# Patient Record
Sex: Female | Born: 1935 | Race: White | Hispanic: No | Smoking: Never smoker
Health system: Southern US, Community
[De-identification: ages and names within clinical notes are randomized; demographics above are authoritative.]

## PROBLEM LIST (undated history)

## (undated) DIAGNOSIS — I2510 Atherosclerotic heart disease of native coronary artery without angina pectoris: Secondary | ICD-10-CM

## (undated) DIAGNOSIS — Z9289 Personal history of other medical treatment: Secondary | ICD-10-CM

## (undated) DIAGNOSIS — C55 Malignant neoplasm of uterus, part unspecified: Secondary | ICD-10-CM

## (undated) DIAGNOSIS — I10 Essential (primary) hypertension: Secondary | ICD-10-CM

## (undated) DIAGNOSIS — I4891 Unspecified atrial fibrillation: Secondary | ICD-10-CM

## (undated) DIAGNOSIS — E785 Hyperlipidemia, unspecified: Secondary | ICD-10-CM

## (undated) HISTORY — DX: Atherosclerotic heart disease of native coronary artery without angina pectoris: I25.10

## (undated) HISTORY — DX: Personal history of other medical treatment: Z92.89

## (undated) HISTORY — DX: Unspecified atrial fibrillation: I48.91

## (undated) HISTORY — DX: Hyperlipidemia, unspecified: E78.5

## (undated) HISTORY — DX: Essential (primary) hypertension: I10

## (undated) HISTORY — PX: FRACTURE SURGERY: SHX138

## (undated) HISTORY — DX: Malignant neoplasm of uterus, part unspecified: C55

---

## 1962-11-25 DIAGNOSIS — C55 Malignant neoplasm of uterus, part unspecified: Secondary | ICD-10-CM

## 1962-11-25 HISTORY — DX: Malignant neoplasm of uterus, part unspecified: C55

## 1962-11-25 HISTORY — PX: ABDOMINAL HYSTERECTOMY: SHX81

## 2000-09-01 ENCOUNTER — Other Ambulatory Visit: Admission: RE | Admit: 2000-09-01 | Discharge: 2000-09-01 | Payer: Self-pay | Admitting: Unknown Physician Specialty

## 2001-01-27 ENCOUNTER — Encounter: Payer: Self-pay | Admitting: Internal Medicine

## 2001-01-27 ENCOUNTER — Encounter: Admission: RE | Admit: 2001-01-27 | Discharge: 2001-01-27 | Payer: Self-pay | Admitting: Internal Medicine

## 2001-04-10 ENCOUNTER — Encounter: Payer: Self-pay | Admitting: Internal Medicine

## 2001-04-10 ENCOUNTER — Encounter: Admission: RE | Admit: 2001-04-10 | Discharge: 2001-04-10 | Payer: Self-pay | Admitting: Internal Medicine

## 2001-07-09 ENCOUNTER — Encounter: Admission: RE | Admit: 2001-07-09 | Discharge: 2001-07-09 | Payer: Self-pay | Admitting: Internal Medicine

## 2001-07-09 ENCOUNTER — Encounter: Payer: Self-pay | Admitting: Internal Medicine

## 2001-09-03 ENCOUNTER — Ambulatory Visit (HOSPITAL_COMMUNITY): Admission: RE | Admit: 2001-09-03 | Discharge: 2001-09-03 | Payer: Self-pay | Admitting: Gastroenterology

## 2002-08-24 ENCOUNTER — Encounter: Admission: RE | Admit: 2002-08-24 | Discharge: 2002-08-24 | Payer: Self-pay | Admitting: Internal Medicine

## 2002-08-24 ENCOUNTER — Encounter: Payer: Self-pay | Admitting: Internal Medicine

## 2003-08-26 ENCOUNTER — Encounter: Payer: Self-pay | Admitting: Internal Medicine

## 2003-08-26 ENCOUNTER — Encounter: Admission: RE | Admit: 2003-08-26 | Discharge: 2003-08-26 | Payer: Self-pay | Admitting: Internal Medicine

## 2004-02-13 ENCOUNTER — Ambulatory Visit: Admission: RE | Admit: 2004-02-13 | Discharge: 2004-02-13 | Payer: Self-pay | Admitting: Internal Medicine

## 2004-08-27 ENCOUNTER — Encounter: Admission: RE | Admit: 2004-08-27 | Discharge: 2004-08-27 | Payer: Self-pay | Admitting: Internal Medicine

## 2005-09-05 ENCOUNTER — Encounter: Admission: RE | Admit: 2005-09-05 | Discharge: 2005-09-05 | Payer: Self-pay | Admitting: Internal Medicine

## 2006-08-06 ENCOUNTER — Encounter: Admission: RE | Admit: 2006-08-06 | Discharge: 2006-08-06 | Payer: Self-pay | Admitting: Internal Medicine

## 2006-12-15 ENCOUNTER — Encounter: Admission: RE | Admit: 2006-12-15 | Discharge: 2006-12-15 | Payer: Self-pay | Admitting: Internal Medicine

## 2006-12-22 ENCOUNTER — Encounter: Admission: RE | Admit: 2006-12-22 | Discharge: 2006-12-22 | Payer: Self-pay | Admitting: Internal Medicine

## 2007-08-10 ENCOUNTER — Encounter: Admission: RE | Admit: 2007-08-10 | Discharge: 2007-08-10 | Payer: Self-pay | Admitting: General Surgery

## 2008-08-15 ENCOUNTER — Encounter: Admission: RE | Admit: 2008-08-15 | Discharge: 2008-08-15 | Payer: Self-pay | Admitting: Internal Medicine

## 2009-08-16 ENCOUNTER — Encounter: Admission: RE | Admit: 2009-08-16 | Discharge: 2009-08-16 | Payer: Self-pay | Admitting: Internal Medicine

## 2010-08-07 ENCOUNTER — Ambulatory Visit (HOSPITAL_COMMUNITY): Admission: RE | Admit: 2010-08-07 | Discharge: 2010-08-07 | Payer: Self-pay | Admitting: Cardiology

## 2010-08-07 HISTORY — PX: TEE WITH CARDIOVERSION: SHX5442

## 2010-08-20 ENCOUNTER — Encounter: Admission: RE | Admit: 2010-08-20 | Discharge: 2010-08-20 | Payer: Self-pay | Admitting: Internal Medicine

## 2010-08-25 DIAGNOSIS — Z9289 Personal history of other medical treatment: Secondary | ICD-10-CM

## 2010-08-25 HISTORY — DX: Personal history of other medical treatment: Z92.89

## 2010-12-15 ENCOUNTER — Encounter: Payer: Self-pay | Admitting: Internal Medicine

## 2011-02-07 LAB — PROTIME-INR
INR: 2.24 — ABNORMAL HIGH (ref 0.00–1.49)
Prothrombin Time: 24.9 seconds — ABNORMAL HIGH (ref 11.6–15.2)

## 2011-02-07 LAB — BASIC METABOLIC PANEL
BUN: 12 mg/dL (ref 6–23)
CO2: 28 mEq/L (ref 19–32)
Chloride: 106 mEq/L (ref 96–112)
Creatinine, Ser: 0.85 mg/dL (ref 0.4–1.2)
GFR calc Af Amer: >60 mL/min (ref >60)
Glucose, Bld: 107 mg/dL — ABNORMAL HIGH (ref 70–99)
Potassium: 4.5 mEq/L (ref 3.5–5.1)
Sodium: 141 mEq/L (ref 135–145)

## 2011-02-07 LAB — CBC
Hemoglobin: 14.6 g/dL (ref 12.0–15.0)
MCH: 28.2 pg (ref 26.0–34.0)
MCHC: 33.4 g/dL (ref 30.0–36.0)
MCV: 84.5 fL (ref 78.0–100.0)
RBC: 5.17 MIL/uL — ABNORMAL HIGH (ref 3.87–5.11)
RDW: 14 % (ref 11.5–15.5)

## 2011-04-12 NOTE — Procedures (Signed)
[place]  Patient:    HANNI, MILFORD Visit Number: [id number] MRN: [id number]          Service Type: END Location: ENDO Attending Physician:  Charna Elizabeth Dictated by:   Anselmo Rod, M.D. Proc. Date: 09/03/01 Admit Date:  09/03/2001   CC:         Rodrigo Ran, M.D.   Procedure Report  DATE OF BIRTH:  1929-04-03.  REFERRING PHYSICIAN:  Rodrigo Ran, M.D.  PROCEDURE PERFORMED:  Screening colonoscopy.  ENDOSCOPIST:  Anselmo Rod, M.D.  INSTRUMENT USED:  Olympus video colonoscope.  INDICATIONS FOR PROCEDURE:  Screening colonoscopy being performed in this 77 year old white female rule out colonic polyps masses, hemorrhoids etc.  PREPROCEDURE PREPARATION:  Informed consent was procured from the patient. The patient was fasted for eight hours prior to the procedure and prepped with a bottle of magnesium citrate and a gallon of NuLytely the night prior to the procedure.  PREPROCEDURE PHYSICAL:  The patient had stable vital signs.  Neck supple. Chest clear to auscultation.  S1, S2 regular.  Abdomen soft with normal abdominal bowel sounds.  DESCRIPTION OF PROCEDURE:  The patient was placed in the left lateral decubitus position and sedated with 50 mg of Demerol and 7 mg of Versed intravenously.  Once the patient was adequately sedated and maintained on low-flow oxygen and continuous cardiac monitoring, the Olympus video colonoscope was advanced from the rectum to the cecum with slight difficulty secondary to some residual stool in the colon.  No masses, polyps, erosions, ulcerations or diverticula were seen.  Small internal hemorrhoids were appreciated on retroflexion in the rectum.  The appendicular orifice and ileocecal valve were clearly visualized and photographed.  IMPRESSION: 1. Healthy-appearing colon except for small nonbleeding internal hemorrhoid. 2. Small amount of residual stool in the colon.  Small lesions could  have been    missed.  RECOMMENDATIONS:  Repeat colorectal cancer screening is recommended in the next 10 years unless the patient were to develop any abnormal symptoms in the interim. Dictated by:   Anselmo Rod, M.D. Attending Physician:  Charna Elizabeth DD:  09/03/01 TD:  09/03/01 Job: [id number]

## 2011-04-15 ENCOUNTER — Encounter (INDEPENDENT_AMBULATORY_CARE_PROVIDER_SITE_OTHER): Payer: Self-pay | Admitting: General Surgery

## 2011-07-16 ENCOUNTER — Other Ambulatory Visit: Payer: Self-pay | Admitting: Internal Medicine

## 2011-07-16 DIAGNOSIS — Z1231 Encounter for screening mammogram for malignant neoplasm of breast: Secondary | ICD-10-CM

## 2011-08-22 ENCOUNTER — Ambulatory Visit
Admission: RE | Admit: 2011-08-22 | Discharge: 2011-08-22 | Disposition: A | Discharge status: Home / Self Care | Payer: Medicare Other | Source: Ambulatory Visit | Attending: Internal Medicine | Admitting: Internal Medicine

## 2011-08-22 DIAGNOSIS — Z1231 Encounter for screening mammogram for malignant neoplasm of breast: Secondary | ICD-10-CM

## 2011-12-17 DIAGNOSIS — H353190 Nonexudative age-related macular degeneration, unspecified eye, stage unspecified: Secondary | ICD-10-CM | POA: Diagnosis not present

## 2011-12-17 DIAGNOSIS — H35379 Puckering of macula, unspecified eye: Secondary | ICD-10-CM | POA: Diagnosis not present

## 2011-12-25 DIAGNOSIS — Z7901 Long term (current) use of anticoagulants: Secondary | ICD-10-CM | POA: Diagnosis not present

## 2011-12-25 DIAGNOSIS — I4891 Unspecified atrial fibrillation: Secondary | ICD-10-CM | POA: Diagnosis not present

## 2012-01-13 ENCOUNTER — Encounter (INDEPENDENT_AMBULATORY_CARE_PROVIDER_SITE_OTHER): Payer: Self-pay | Admitting: General Surgery

## 2012-01-13 ENCOUNTER — Ambulatory Visit (INDEPENDENT_AMBULATORY_CARE_PROVIDER_SITE_OTHER): Payer: Medicare Other | Admitting: General Surgery

## 2012-01-13 VITALS — BP 108/66 | HR 88 | Temp 97.2°F | Resp 16 | Ht 68.5 in | Wt 194.2 lb

## 2012-01-13 DIAGNOSIS — N6452 Nipple discharge: Secondary | ICD-10-CM | POA: Insufficient documentation

## 2012-01-13 DIAGNOSIS — N6459 Other signs and symptoms in breast: Secondary | ICD-10-CM

## 2012-01-13 NOTE — Patient Instructions (Signed)
Continue regular self exams  

## 2012-01-13 NOTE — Progress Notes (Signed)
Subjective:     Patient ID: Sheena Benson, female   DOB: May 25, 1929, 78 y.o.   MRN: [id number]  HPI The patient is an 78 year old white female who is now 5 years out from some clear nipple discharge from the right breast. It occurs about 2 times a week. She denies any breast pain. She has had mammograms MRIs and ductograms that have  all been negative. She has been well since her last visit and has no complaints  Review of Systems  Constitutional: Negative.   HENT: Negative.   Eyes: Negative.   Respiratory: Negative.   Cardiovascular: Negative.   Gastrointestinal: Negative.   Genitourinary: Negative.   Musculoskeletal: Negative.   Skin: Negative.   Neurological: Negative.   Hematological: Negative.   Psychiatric/Behavioral: Negative.        Objective:   Physical Exam  Constitutional: She is oriented to person, place, and time. She appears well-developed and well-nourished.  HENT:  Head: Normocephalic and atraumatic.  Eyes: Conjunctivae and EOM are normal. Pupils are equal, round, and reactive to light.  Neck: Normal range of motion. Neck supple.  Cardiovascular: Normal rate, regular rhythm and normal heart sounds.   Pulmonary/Chest: Effort normal and breath sounds normal.       No palpable mass in either breast. no axillary supraclavicular or cervical lymphadenopathy on either side. She still has clear nipple discharge from the center of the right nipple with palpation  Abdominal: Soft. Bowel sounds are normal. She exhibits no mass. There is no tenderness.  Musculoskeletal: Normal range of motion.  Neurological: She is alert and oriented to person, place, and time.  Skin: Skin is warm and dry.  Psychiatric: She has a normal mood and affect. Her behavior is normal.       Assessment:     Persistent clear nipple discharge from the right breast.    Plan:     Her recent mammogram showed no evidence of malignancy. She will she will continue to do regular self exams. We'll plan  to see her back in one year.

## 2012-01-21 DIAGNOSIS — M5137 Other intervertebral disc degeneration, lumbosacral region: Secondary | ICD-10-CM | POA: Diagnosis not present

## 2012-01-21 DIAGNOSIS — M999 Biomechanical lesion, unspecified: Secondary | ICD-10-CM | POA: Diagnosis not present

## 2012-01-23 DIAGNOSIS — M5137 Other intervertebral disc degeneration, lumbosacral region: Secondary | ICD-10-CM | POA: Diagnosis not present

## 2012-01-23 DIAGNOSIS — M999 Biomechanical lesion, unspecified: Secondary | ICD-10-CM | POA: Diagnosis not present

## 2012-01-27 DIAGNOSIS — M5137 Other intervertebral disc degeneration, lumbosacral region: Secondary | ICD-10-CM | POA: Diagnosis not present

## 2012-01-27 DIAGNOSIS — M999 Biomechanical lesion, unspecified: Secondary | ICD-10-CM | POA: Diagnosis not present

## 2012-01-30 DIAGNOSIS — M999 Biomechanical lesion, unspecified: Secondary | ICD-10-CM | POA: Diagnosis not present

## 2012-01-30 DIAGNOSIS — M5137 Other intervertebral disc degeneration, lumbosacral region: Secondary | ICD-10-CM | POA: Diagnosis not present

## 2012-02-05 DIAGNOSIS — Z7901 Long term (current) use of anticoagulants: Secondary | ICD-10-CM | POA: Diagnosis not present

## 2012-02-05 DIAGNOSIS — M5137 Other intervertebral disc degeneration, lumbosacral region: Secondary | ICD-10-CM | POA: Diagnosis not present

## 2012-02-05 DIAGNOSIS — M999 Biomechanical lesion, unspecified: Secondary | ICD-10-CM | POA: Diagnosis not present

## 2012-02-05 DIAGNOSIS — I4891 Unspecified atrial fibrillation: Secondary | ICD-10-CM | POA: Diagnosis not present

## 2012-03-18 DIAGNOSIS — Z7901 Long term (current) use of anticoagulants: Secondary | ICD-10-CM | POA: Diagnosis not present

## 2012-03-18 DIAGNOSIS — I4891 Unspecified atrial fibrillation: Secondary | ICD-10-CM | POA: Diagnosis not present

## 2012-04-28 DIAGNOSIS — I4891 Unspecified atrial fibrillation: Secondary | ICD-10-CM | POA: Diagnosis not present

## 2012-04-28 DIAGNOSIS — Z7901 Long term (current) use of anticoagulants: Secondary | ICD-10-CM | POA: Diagnosis not present

## 2012-06-10 DIAGNOSIS — Z7901 Long term (current) use of anticoagulants: Secondary | ICD-10-CM | POA: Diagnosis not present

## 2012-06-10 DIAGNOSIS — I4891 Unspecified atrial fibrillation: Secondary | ICD-10-CM | POA: Diagnosis not present

## 2012-06-18 DIAGNOSIS — H353190 Nonexudative age-related macular degeneration, unspecified eye, stage unspecified: Secondary | ICD-10-CM | POA: Diagnosis not present

## 2012-06-18 DIAGNOSIS — H35379 Puckering of macula, unspecified eye: Secondary | ICD-10-CM | POA: Diagnosis not present

## 2012-07-22 DIAGNOSIS — Z7901 Long term (current) use of anticoagulants: Secondary | ICD-10-CM | POA: Diagnosis not present

## 2012-07-22 DIAGNOSIS — I4891 Unspecified atrial fibrillation: Secondary | ICD-10-CM | POA: Diagnosis not present

## 2012-07-23 ENCOUNTER — Other Ambulatory Visit: Payer: Self-pay | Admitting: Internal Medicine

## 2012-07-23 DIAGNOSIS — Z1231 Encounter for screening mammogram for malignant neoplasm of breast: Secondary | ICD-10-CM

## 2012-08-07 DIAGNOSIS — Z23 Encounter for immunization: Secondary | ICD-10-CM | POA: Diagnosis not present

## 2012-08-24 ENCOUNTER — Ambulatory Visit
Admission: RE | Admit: 2012-08-24 | Discharge: 2012-08-24 | Disposition: A | Discharge status: Home / Self Care | Payer: Medicare Other | Source: Ambulatory Visit | Attending: Internal Medicine | Admitting: Internal Medicine

## 2012-08-24 DIAGNOSIS — Z1231 Encounter for screening mammogram for malignant neoplasm of breast: Secondary | ICD-10-CM | POA: Diagnosis not present

## 2012-08-25 ENCOUNTER — Telehealth (INDEPENDENT_AMBULATORY_CARE_PROVIDER_SITE_OTHER): Payer: Self-pay | Admitting: General Surgery

## 2012-08-25 NOTE — Telephone Encounter (Signed)
Message copied by Littie Deeds on Tue Aug 25, 2012 12:48 PM ------      Message from: Caleen Essex III      Created: Tue Aug 25, 2012  8:48 AM       Looks neg

## 2012-08-25 NOTE — Telephone Encounter (Signed)
Spoke with pt and informed her that we received her MGM report and that it came back negative.

## 2012-09-02 DIAGNOSIS — Z7901 Long term (current) use of anticoagulants: Secondary | ICD-10-CM | POA: Diagnosis not present

## 2012-09-02 DIAGNOSIS — I4891 Unspecified atrial fibrillation: Secondary | ICD-10-CM | POA: Diagnosis not present

## 2012-10-14 DIAGNOSIS — I4891 Unspecified atrial fibrillation: Secondary | ICD-10-CM | POA: Diagnosis not present

## 2012-10-14 DIAGNOSIS — Z7901 Long term (current) use of anticoagulants: Secondary | ICD-10-CM | POA: Diagnosis not present

## 2012-11-11 DIAGNOSIS — I4891 Unspecified atrial fibrillation: Secondary | ICD-10-CM | POA: Diagnosis not present

## 2012-11-11 DIAGNOSIS — Z7901 Long term (current) use of anticoagulants: Secondary | ICD-10-CM | POA: Diagnosis not present

## 2012-11-23 DIAGNOSIS — I4891 Unspecified atrial fibrillation: Secondary | ICD-10-CM | POA: Diagnosis not present

## 2012-11-23 DIAGNOSIS — I10 Essential (primary) hypertension: Secondary | ICD-10-CM | POA: Diagnosis not present

## 2012-11-27 DIAGNOSIS — R7301 Impaired fasting glucose: Secondary | ICD-10-CM | POA: Diagnosis not present

## 2012-11-27 DIAGNOSIS — E785 Hyperlipidemia, unspecified: Secondary | ICD-10-CM | POA: Diagnosis not present

## 2012-11-27 DIAGNOSIS — I10 Essential (primary) hypertension: Secondary | ICD-10-CM | POA: Diagnosis not present

## 2012-11-27 DIAGNOSIS — E559 Vitamin D deficiency, unspecified: Secondary | ICD-10-CM | POA: Diagnosis not present

## 2012-12-04 DIAGNOSIS — E785 Hyperlipidemia, unspecified: Secondary | ICD-10-CM | POA: Diagnosis not present

## 2012-12-04 DIAGNOSIS — I10 Essential (primary) hypertension: Secondary | ICD-10-CM | POA: Diagnosis not present

## 2012-12-04 DIAGNOSIS — Z1331 Encounter for screening for depression: Secondary | ICD-10-CM | POA: Diagnosis not present

## 2012-12-04 DIAGNOSIS — Z0000 Encounter for general adult medical examination without abnormal findings: Secondary | ICD-10-CM | POA: Diagnosis not present

## 2012-12-04 DIAGNOSIS — Z124 Encounter for screening for malignant neoplasm of cervix: Secondary | ICD-10-CM | POA: Diagnosis not present

## 2012-12-09 DIAGNOSIS — I4891 Unspecified atrial fibrillation: Secondary | ICD-10-CM | POA: Diagnosis not present

## 2012-12-09 DIAGNOSIS — Z7901 Long term (current) use of anticoagulants: Secondary | ICD-10-CM | POA: Diagnosis not present

## 2012-12-14 DIAGNOSIS — Z1212 Encounter for screening for malignant neoplasm of rectum: Secondary | ICD-10-CM | POA: Diagnosis not present

## 2012-12-15 DIAGNOSIS — M949 Disorder of cartilage, unspecified: Secondary | ICD-10-CM | POA: Diagnosis not present

## 2012-12-15 DIAGNOSIS — Z23 Encounter for immunization: Secondary | ICD-10-CM | POA: Diagnosis not present

## 2012-12-15 DIAGNOSIS — Z780 Asymptomatic menopausal state: Secondary | ICD-10-CM | POA: Diagnosis not present

## 2013-01-11 DIAGNOSIS — I4891 Unspecified atrial fibrillation: Secondary | ICD-10-CM | POA: Diagnosis not present

## 2013-01-11 DIAGNOSIS — Z7901 Long term (current) use of anticoagulants: Secondary | ICD-10-CM | POA: Diagnosis not present

## 2013-01-13 ENCOUNTER — Encounter (INDEPENDENT_AMBULATORY_CARE_PROVIDER_SITE_OTHER): Payer: Self-pay | Admitting: General Surgery

## 2013-01-13 ENCOUNTER — Ambulatory Visit (INDEPENDENT_AMBULATORY_CARE_PROVIDER_SITE_OTHER): Payer: Medicare Other | Admitting: General Surgery

## 2013-01-13 VITALS — BP 122/68 | HR 72 | Temp 97.9°F | Resp 18 | Ht 68.5 in | Wt 196.4 lb

## 2013-01-13 DIAGNOSIS — N6452 Nipple discharge: Secondary | ICD-10-CM

## 2013-01-13 DIAGNOSIS — N6459 Other signs and symptoms in breast: Secondary | ICD-10-CM

## 2013-01-13 NOTE — Progress Notes (Signed)
Subjective:     Patient ID: Sheena Benson, female   DOB: 02-12-1929, 79 y.o.   MRN: [id number]  HPI The patient is an 79 year old white female who we have been following for clearer nipple discharge. Since her last visit her discharge is unchanged. It occurs maybe once a week. It does not bother her. The discharge is always clear. She denies any breast pain. She has had no major medical problems in the last year.  Review of Systems  Constitutional: Negative.   HENT: Negative.   Eyes: Negative.   Respiratory: Negative.   Cardiovascular: Negative.   Gastrointestinal: Negative.   Endocrine: Negative.   Genitourinary: Negative.   Musculoskeletal: Negative.   Skin: Negative.   Allergic/Immunologic: Negative.   Neurological: Negative.   Hematological: Negative.   Psychiatric/Behavioral: Negative.        Objective:   Physical Exam  Constitutional: She is oriented to person, place, and time. She appears well-developed and well-nourished.  HENT:  Head: Normocephalic and atraumatic.  Eyes: Conjunctivae and EOM are normal. Pupils are equal, round, and reactive to light.  Neck: Normal range of motion. Neck supple.  Cardiovascular: Normal rate, regular rhythm and normal heart sounds.   Pulmonary/Chest: Effort normal and breath sounds normal.  There is no palpable mass in either breast. There is some clear nipple discharge from the center of the right nipple with breast compression. There is no palpable axillary or supraclavicular cervical lymphadenopathy.  Abdominal: Soft. Bowel sounds are normal. She exhibits no mass. There is no tenderness.  Musculoskeletal: Normal range of motion.  Lymphadenopathy:    She has no cervical adenopathy.  Neurological: She is alert and oriented to person, place, and time.  Skin: Skin is warm and dry.  Psychiatric: She has a normal mood and affect. Her behavior is normal.       Assessment:     The patient continues to have clear nipple discharge from  the right breast. Her recent mammogram showed no evidence of malignancy. We went over in detail the different options for treatment. She would like to avoid surgery.     Plan:     At this point she will continue to do regular self exams. We will plan to see her back in one year.

## 2013-01-13 NOTE — Patient Instructions (Signed)
Continue regular self exams  

## 2013-02-11 ENCOUNTER — Ambulatory Visit: Payer: Self-pay | Admitting: Internal Medicine

## 2013-02-11 DIAGNOSIS — I4891 Unspecified atrial fibrillation: Secondary | ICD-10-CM

## 2013-02-11 DIAGNOSIS — Z7901 Long term (current) use of anticoagulants: Secondary | ICD-10-CM | POA: Insufficient documentation

## 2013-02-11 DIAGNOSIS — I4821 Permanent atrial fibrillation: Secondary | ICD-10-CM | POA: Insufficient documentation

## 2013-02-22 DIAGNOSIS — Z7901 Long term (current) use of anticoagulants: Secondary | ICD-10-CM | POA: Diagnosis not present

## 2013-02-22 DIAGNOSIS — I4891 Unspecified atrial fibrillation: Secondary | ICD-10-CM | POA: Diagnosis not present

## 2013-03-22 DIAGNOSIS — Z7901 Long term (current) use of anticoagulants: Secondary | ICD-10-CM | POA: Diagnosis not present

## 2013-03-22 DIAGNOSIS — I4891 Unspecified atrial fibrillation: Secondary | ICD-10-CM | POA: Diagnosis not present

## 2013-04-13 DIAGNOSIS — I4891 Unspecified atrial fibrillation: Secondary | ICD-10-CM | POA: Diagnosis not present

## 2013-04-13 DIAGNOSIS — I10 Essential (primary) hypertension: Secondary | ICD-10-CM | POA: Diagnosis not present

## 2013-04-13 DIAGNOSIS — R7301 Impaired fasting glucose: Secondary | ICD-10-CM | POA: Diagnosis not present

## 2013-04-13 DIAGNOSIS — R42 Dizziness and giddiness: Secondary | ICD-10-CM | POA: Diagnosis not present

## 2013-05-03 ENCOUNTER — Ambulatory Visit (INDEPENDENT_AMBULATORY_CARE_PROVIDER_SITE_OTHER): Payer: Medicare Other | Admitting: Pharmacist Clinician (PhC)/ Clinical Pharmacy Specialist

## 2013-05-03 VITALS — BP 110/84 | HR 72

## 2013-05-03 DIAGNOSIS — Z7901 Long term (current) use of anticoagulants: Secondary | ICD-10-CM | POA: Diagnosis not present

## 2013-05-03 DIAGNOSIS — I4891 Unspecified atrial fibrillation: Secondary | ICD-10-CM | POA: Diagnosis not present

## 2013-06-14 ENCOUNTER — Ambulatory Visit (INDEPENDENT_AMBULATORY_CARE_PROVIDER_SITE_OTHER): Payer: Medicare Other | Admitting: Pharmacist Clinician (PhC)/ Clinical Pharmacy Specialist

## 2013-06-14 VITALS — BP 120/84 | HR 68

## 2013-06-14 DIAGNOSIS — I4891 Unspecified atrial fibrillation: Secondary | ICD-10-CM | POA: Diagnosis not present

## 2013-06-14 DIAGNOSIS — Z7901 Long term (current) use of anticoagulants: Secondary | ICD-10-CM | POA: Diagnosis not present

## 2013-06-17 DIAGNOSIS — H35379 Puckering of macula, unspecified eye: Secondary | ICD-10-CM | POA: Diagnosis not present

## 2013-06-17 DIAGNOSIS — H43819 Vitreous degeneration, unspecified eye: Secondary | ICD-10-CM | POA: Diagnosis not present

## 2013-06-17 DIAGNOSIS — H353190 Nonexudative age-related macular degeneration, unspecified eye, stage unspecified: Secondary | ICD-10-CM | POA: Diagnosis not present

## 2013-07-14 ENCOUNTER — Other Ambulatory Visit: Payer: Self-pay | Admitting: Internal Medicine

## 2013-07-27 ENCOUNTER — Ambulatory Visit (INDEPENDENT_AMBULATORY_CARE_PROVIDER_SITE_OTHER): Payer: Medicare Other | Admitting: Pharmacist Clinician (PhC)/ Clinical Pharmacy Specialist

## 2013-07-27 VITALS — BP 100/56 | HR 80

## 2013-07-27 DIAGNOSIS — Z7901 Long term (current) use of anticoagulants: Secondary | ICD-10-CM

## 2013-07-27 DIAGNOSIS — I4891 Unspecified atrial fibrillation: Secondary | ICD-10-CM

## 2013-07-28 ENCOUNTER — Other Ambulatory Visit: Payer: Self-pay

## 2013-07-28 DIAGNOSIS — Z1231 Encounter for screening mammogram for malignant neoplasm of breast: Secondary | ICD-10-CM

## 2013-08-09 DIAGNOSIS — Z23 Encounter for immunization: Secondary | ICD-10-CM | POA: Diagnosis not present

## 2013-08-25 ENCOUNTER — Ambulatory Visit
Admission: RE | Admit: 2013-08-25 | Discharge: 2013-08-25 | Disposition: A | Discharge status: Home / Self Care | Payer: Medicare Other | Source: Ambulatory Visit

## 2013-08-25 DIAGNOSIS — Z1231 Encounter for screening mammogram for malignant neoplasm of breast: Secondary | ICD-10-CM | POA: Diagnosis not present

## 2013-08-30 ENCOUNTER — Other Ambulatory Visit: Payer: Self-pay | Admitting: Internal Medicine

## 2013-08-30 DIAGNOSIS — R928 Other abnormal and inconclusive findings on diagnostic imaging of breast: Secondary | ICD-10-CM

## 2013-08-31 ENCOUNTER — Encounter (INDEPENDENT_AMBULATORY_CARE_PROVIDER_SITE_OTHER): Payer: Self-pay

## 2013-08-31 ENCOUNTER — Telehealth (INDEPENDENT_AMBULATORY_CARE_PROVIDER_SITE_OTHER): Payer: Self-pay

## 2013-08-31 ENCOUNTER — Telehealth (INDEPENDENT_AMBULATORY_CARE_PROVIDER_SITE_OTHER): Payer: Self-pay | Admitting: *Deleted

## 2013-08-31 ENCOUNTER — Other Ambulatory Visit (INDEPENDENT_AMBULATORY_CARE_PROVIDER_SITE_OTHER): Payer: Self-pay

## 2013-08-31 DIAGNOSIS — R921 Mammographic calcification found on diagnostic imaging of breast: Secondary | ICD-10-CM

## 2013-08-31 NOTE — Telephone Encounter (Signed)
Patient states the [facility] called the patient about her recent MMG 08-26-13, at that time the patient informed them she had a nipple discharge she had forgotten to tell them about  when she had her MMG on 08-26-13, they instructed her to call Dr. Carolynne Edouard to ask if she needed to have a repeat RT MMG. I expressed I would pass this to DR. Carolynne Edouard and we will be getting  Back with her and the [facility].

## 2013-08-31 NOTE — Telephone Encounter (Signed)
Spoke with pt to inform her of her appt at the [facility] center on 09/13/13 at 7:45 am.

## 2013-08-31 NOTE — Telephone Encounter (Signed)
I called pt back regarding her recent MM. She expressed she was confused on why she had to call our office to see if another MM was needed. I explained I wasn't sure myself. Usually [facility] will call us with these questions. Dr Carolynne Edouard reviewed MM and advised he doesn't think another MM is needed but that a biopsy of the calcifications is needed. Advised I will put an order in for biopsy and told she would be called with an appointment.

## 2013-09-02 ENCOUNTER — Encounter: Payer: Self-pay | Admitting: Internal Medicine

## 2013-09-07 ENCOUNTER — Telehealth: Payer: Self-pay | Admitting: Internal Medicine

## 2013-09-07 NOTE — Telephone Encounter (Signed)
Needs to hold warfarin for 5 days - ok per Dr. Rennis Golden (see letter), having breast biopsy Monday October 20, will cancel appt for 10/17.

## 2013-09-07 NOTE — Telephone Encounter (Signed)
Pt was told to come off coumadin starting today and wanted to know if it was ok.

## 2013-09-08 ENCOUNTER — Ambulatory Visit: Payer: Medicare Other | Admitting: Pharmacist Clinician (PhC)/ Clinical Pharmacy Specialist

## 2013-09-08 ENCOUNTER — Other Ambulatory Visit (INDEPENDENT_AMBULATORY_CARE_PROVIDER_SITE_OTHER): Payer: Self-pay | Admitting: General Surgery

## 2013-09-08 DIAGNOSIS — R928 Other abnormal and inconclusive findings on diagnostic imaging of breast: Secondary | ICD-10-CM

## 2013-09-10 ENCOUNTER — Ambulatory Visit: Payer: Medicare Other | Admitting: Pharmacist Clinician (PhC)/ Clinical Pharmacy Specialist

## 2013-09-13 ENCOUNTER — Other Ambulatory Visit (INDEPENDENT_AMBULATORY_CARE_PROVIDER_SITE_OTHER): Payer: Self-pay | Admitting: General Surgery

## 2013-09-13 ENCOUNTER — Ambulatory Visit
Admission: RE | Admit: 2013-09-13 | Discharge: 2013-09-13 | Disposition: A | Discharge status: Home / Self Care | Payer: Medicare Other | Source: Ambulatory Visit | Attending: General Surgery | Admitting: General Surgery

## 2013-09-13 ENCOUNTER — Ambulatory Visit
Admission: RE | Admit: 2013-09-13 | Discharge: 2013-09-13 | Disposition: A | Discharge status: Home / Self Care | Payer: Medicare Other | Source: Ambulatory Visit | Attending: Internal Medicine | Admitting: Internal Medicine

## 2013-09-13 DIAGNOSIS — R921 Mammographic calcification found on diagnostic imaging of breast: Secondary | ICD-10-CM

## 2013-09-13 DIAGNOSIS — D249 Benign neoplasm of unspecified breast: Secondary | ICD-10-CM | POA: Diagnosis not present

## 2013-09-13 DIAGNOSIS — R928 Other abnormal and inconclusive findings on diagnostic imaging of breast: Secondary | ICD-10-CM

## 2013-09-13 DIAGNOSIS — N6459 Other signs and symptoms in breast: Secondary | ICD-10-CM | POA: Diagnosis not present

## 2013-09-21 ENCOUNTER — Ambulatory Visit (INDEPENDENT_AMBULATORY_CARE_PROVIDER_SITE_OTHER): Payer: Medicare Other | Admitting: General Surgery

## 2013-09-21 ENCOUNTER — Encounter (INDEPENDENT_AMBULATORY_CARE_PROVIDER_SITE_OTHER): Payer: Self-pay

## 2013-09-21 ENCOUNTER — Encounter (INDEPENDENT_AMBULATORY_CARE_PROVIDER_SITE_OTHER): Payer: Self-pay | Admitting: General Surgery

## 2013-09-21 VITALS — BP 100/68 | HR 68 | Temp 98.0°F | Resp 14 | Ht 68.5 in | Wt 193.2 lb

## 2013-09-21 DIAGNOSIS — D241 Benign neoplasm of right breast: Secondary | ICD-10-CM

## 2013-09-21 DIAGNOSIS — D249 Benign neoplasm of unspecified breast: Secondary | ICD-10-CM

## 2013-09-21 NOTE — Patient Instructions (Signed)
Continue regular self exams  

## 2013-09-22 ENCOUNTER — Ambulatory Visit (INDEPENDENT_AMBULATORY_CARE_PROVIDER_SITE_OTHER): Payer: Medicare Other | Admitting: Pharmacist Clinician (PhC)/ Clinical Pharmacy Specialist

## 2013-09-22 VITALS — BP 110/60 | HR 96

## 2013-09-22 DIAGNOSIS — Z7901 Long term (current) use of anticoagulants: Secondary | ICD-10-CM

## 2013-09-22 DIAGNOSIS — I4891 Unspecified atrial fibrillation: Secondary | ICD-10-CM

## 2013-09-22 LAB — POCT INR: INR: 1.7

## 2013-10-08 NOTE — Progress Notes (Signed)
Subjective:     Patient ID: Sheena Benson, female   DOB: January 10, 1929, 79 y.o.   MRN: [id number]  HPI The patient is an 79 year old white female who we have followed for some time for clear nipple discharge from the right breast. She denies any breast pain. The discharges only sporadic and does not cause her any distress. She recently had a mammogram were some calcifications were biopsied and the pathology returned fibrocystic disease. She has otherwise been well.  Review of Systems  Constitutional: Negative.   HENT: Negative.   Eyes: Negative.   Respiratory: Negative.   Cardiovascular: Negative.   Gastrointestinal: Negative.   Endocrine: Negative.   Genitourinary: Negative.   Musculoskeletal: Negative.   Skin: Negative.   Allergic/Immunologic: Negative.   Neurological: Negative.   Hematological: Negative.   Psychiatric/Behavioral: Negative.        Objective:   Physical Exam  Constitutional: She is oriented to person, place, and time. She appears well-developed and well-nourished.  HENT:  Head: Normocephalic and atraumatic.  Eyes: Conjunctivae and EOM are normal. Pupils are equal, round, and reactive to light.  Neck: Normal range of motion. Neck supple.  Cardiovascular: Normal rate, regular rhythm and normal heart sounds.   Pulmonary/Chest: Effort normal and breath sounds normal.  There is no palpable mass in either breast. There is no palpable axillary, supraclavicular, or cervical lymphadenopathy. Since her biopsy her discharge has stopped.  Abdominal: Soft. Bowel sounds are normal. She exhibits no mass. There is no tenderness.  Musculoskeletal: Normal range of motion.  Lymphadenopathy:    She has no cervical adenopathy.  Neurological: She is alert and oriented to person, place, and time.  Skin: Skin is warm and dry.  Psychiatric: She has a normal mood and affect. Her behavior is normal.       Assessment:     The patient is an 79 year old female with what appears to be  benign disease by biopsy and since the biopsy her discharge has quit. I've talked her in depth about the different options for managing this and at this point she has no desire to have any surgery.     Plan:     We will plan to see her back in 3 months to follow her exam.

## 2013-10-28 ENCOUNTER — Encounter: Payer: Self-pay | Admitting: *Deleted

## 2013-10-29 ENCOUNTER — Encounter: Payer: Self-pay | Admitting: Internal Medicine

## 2013-10-29 ENCOUNTER — Ambulatory Visit (INDEPENDENT_AMBULATORY_CARE_PROVIDER_SITE_OTHER): Payer: Medicare Other | Admitting: Internal Medicine

## 2013-10-29 ENCOUNTER — Ambulatory Visit (INDEPENDENT_AMBULATORY_CARE_PROVIDER_SITE_OTHER): Payer: Medicare Other | Admitting: Pharmacist Clinician (PhC)/ Clinical Pharmacy Specialist

## 2013-10-29 VITALS — BP 120/70 | HR 83 | Ht 68.5 in | Wt 194.8 lb

## 2013-10-29 DIAGNOSIS — Z7901 Long term (current) use of anticoagulants: Secondary | ICD-10-CM | POA: Diagnosis not present

## 2013-10-29 DIAGNOSIS — I4891 Unspecified atrial fibrillation: Secondary | ICD-10-CM

## 2013-10-29 NOTE — Patient Instructions (Signed)
Your physician wants you to follow-up in: 1 year. You will receive a reminder letter in the mail two months in advance. If you don't receive a letter, please call our office to schedule the follow-up appointment.  Happy Early [place] Ouch!

## 2013-10-29 NOTE — Progress Notes (Signed)
    OFFICE NOTE  Chief Complaint:  No complaints  Primary Care Physician: Sheena Kayser, MD  HPI:  Sheena Benson is an 79 year old female now whom I have been following for atrial fibrillation which is persistent, is not chronic. She had a TEE cardioversion which restored sinus but quickly went back into AFib, was tried on Multaq and was intolerant of that, and actually was not really certain that she was aware of her AFib. Then we decided to pursue rate control and she has done very well with that. She has been therapeutic in her INRs on Coumadin without any bleeding problems. She is without complaints. Her INR was checked today and it was 1.9.  PMHx:  Past Medical History  Diagnosis Date  . Atrial fibrillation     intolerant to Multaq  . Hyperlipidemia   . Hypertension   . Uterine cancer 1964  . CAD (coronary artery disease)     mild  . History of nuclear stress test 08/2010    dipyridamole; mild ischemia in mid anterior & apical anterior regions; post-stress EF 72%    Past Surgical History  Procedure Laterality Date  . Fracture surgery      foot  . Tee with cardioversion  08/07/2010    EF 55-60%; mild MR; SR back into AF (Dr. Kirtland Bouchard. [place])   . Abdominal hysterectomy  1964    FAMHx:  Family History  Problem Relation Age of Onset  . Heart disease Mother     SOCHx:   reports that she has never smoked. She has never used smokeless tobacco. She reports that she does not drink alcohol or use illicit drugs.  ALLERGIES:  No Known Allergies  ROS: A comprehensive review of systems was negative.  HOME MEDS: Current Outpatient Prescriptions  Medication Sig Dispense Refill  . ergocalciferol (VITAMIN D2) 50000 UNITS capsule Take 50,000 Units by mouth 2 (two) times a week.       Marland Kitchen lisinopril (PRINIVIL,ZESTRIL) 10 MG tablet Take 1 tablet by mouth every morning.      . simvastatin (ZOCOR) 10 MG tablet Take 10 mg by mouth daily.       . TOPROL XL 25 MG 24 hr tablet Take 25  mg by mouth daily.       Marland Kitchen warfarin (COUMADIN) 2.5 MG tablet TAKE 1 TABLET DAILY AS DIRECTED  90 tablet  1   No current facility-administered medications for this visit.    LABS/IMAGING: No results found for this or any previous visit (from the past 48 hour(s)). No results found.  VITALS: BP 120/70  Pulse 83  Ht 5' 8.5" (1.74 m)  Wt 194 lb 12.8 oz (88.361 kg)  BMI 29.19 kg/m2  EXAM: General appearance: alert and no distress Neck: no carotid bruit, no JVD and thyroid not enlarged, symmetric, no tenderness/mass/nodules Lungs: clear to auscultation bilaterally Heart: irregularly irregular rhythm  EKG: Atrial fibrillation with controlled ventricular response of 83  ASSESSMENT: 1. Chronic atrial fibrillation 2. Hypertension - controlled  PLAN: 1.   Sheena Benson has had good control of her atrial fibrillation. Her INR is 1.9 today, I do not think changes will be made to her regimen. Her blood pressure is well controlled. Overall she is asymptomatic and doing quite well. She'll be celebrating her 84nd birthday this weekend. I'll plan to see her back in one year.  Sheena Nose, MD, [facility] Attending Cardiologist [facility]  Sheena Benson 10/29/2013, 3:45 PM

## 2013-11-03 ENCOUNTER — Ambulatory Visit: Payer: Medicare Other | Admitting: Pharmacist Clinician (PhC)/ Clinical Pharmacy Specialist

## 2013-12-09 ENCOUNTER — Ambulatory Visit (INDEPENDENT_AMBULATORY_CARE_PROVIDER_SITE_OTHER): Payer: Medicare Other | Admitting: Pharmacist Clinician (PhC)/ Clinical Pharmacy Specialist

## 2013-12-09 VITALS — BP 130/82 | HR 72

## 2013-12-09 DIAGNOSIS — I4891 Unspecified atrial fibrillation: Secondary | ICD-10-CM

## 2013-12-09 DIAGNOSIS — Z7901 Long term (current) use of anticoagulants: Secondary | ICD-10-CM

## 2013-12-09 LAB — POCT INR: INR: 1.6

## 2013-12-15 ENCOUNTER — Ambulatory Visit (INDEPENDENT_AMBULATORY_CARE_PROVIDER_SITE_OTHER): Payer: Medicare Other | Admitting: General Surgery

## 2013-12-15 ENCOUNTER — Encounter (INDEPENDENT_AMBULATORY_CARE_PROVIDER_SITE_OTHER): Payer: Self-pay

## 2013-12-15 VITALS — BP 132/76 | HR 71 | Temp 98.0°F | Resp 18 | Ht 68.0 in | Wt 192.0 lb

## 2013-12-15 DIAGNOSIS — D241 Benign neoplasm of right breast: Secondary | ICD-10-CM

## 2013-12-15 DIAGNOSIS — D249 Benign neoplasm of unspecified breast: Secondary | ICD-10-CM

## 2013-12-15 NOTE — Patient Instructions (Signed)
Continue regular self exams  

## 2013-12-15 NOTE — Progress Notes (Signed)
Subjective:     Patient ID: Sheena Benson, female   DOB: January 01, 1929, 80 y.o.   MRN: [id number]  HPI The patient is an 80 year old white female who we have been following for a right breast papilloma and discharge for about 2 years. Last year she had some calcifications biopsied they came back benign. Since that time her discharge from her nipple has disappeared. She denies any pain in the breast. She otherwise has no complaints.  Review of Systems  Constitutional: Negative.   HENT: Negative.   Eyes: Negative.   Respiratory: Negative.   Cardiovascular: Negative.   Gastrointestinal: Negative.   Endocrine: Negative.   Genitourinary: Negative.   Musculoskeletal: Negative.   Skin: Negative.   Allergic/Immunologic: Negative.   Neurological: Negative.   Hematological: Negative.   Psychiatric/Behavioral: Negative.        Objective:   Physical Exam  Constitutional: She is oriented to person, place, and time. She appears well-developed and well-nourished.  HENT:  Head: Normocephalic and atraumatic.  Eyes: Conjunctivae and EOM are normal. Pupils are equal, round, and reactive to light.  Neck: Normal range of motion. Neck supple.  Cardiovascular: Normal rate, regular rhythm and normal heart sounds.   Pulmonary/Chest: Effort normal and breath sounds normal.  There is no palpable mass in either breast. There is no palpable axillary, supraclavicular, or cervical lymphadenopathy  Abdominal: Soft. Bowel sounds are normal.  Musculoskeletal: Normal range of motion.  Lymphadenopathy:    She has no cervical adenopathy.  Neurological: She is alert and oriented to person, place, and time.  Skin: Skin is warm and dry.  Psychiatric: She has a normal mood and affect. Her behavior is normal.       Assessment:     The patient has been followed for 2 years for a right breast papilloma and nipple discharge. The discharge has resolved. She is otherwise asymptomatic     Plan:     At this point she  will continue to do regular self exams. She will continue to get her yearly mammogram. We will plan to see her back in about 6 months

## 2013-12-23 DIAGNOSIS — H26499 Other secondary cataract, unspecified eye: Secondary | ICD-10-CM | POA: Diagnosis not present

## 2013-12-23 DIAGNOSIS — H353190 Nonexudative age-related macular degeneration, unspecified eye, stage unspecified: Secondary | ICD-10-CM | POA: Diagnosis not present

## 2013-12-23 DIAGNOSIS — H43819 Vitreous degeneration, unspecified eye: Secondary | ICD-10-CM | POA: Diagnosis not present

## 2013-12-28 DIAGNOSIS — R82998 Other abnormal findings in urine: Secondary | ICD-10-CM | POA: Diagnosis not present

## 2013-12-28 DIAGNOSIS — R7301 Impaired fasting glucose: Secondary | ICD-10-CM | POA: Diagnosis not present

## 2013-12-28 DIAGNOSIS — R809 Proteinuria, unspecified: Secondary | ICD-10-CM | POA: Diagnosis not present

## 2013-12-28 DIAGNOSIS — E559 Vitamin D deficiency, unspecified: Secondary | ICD-10-CM | POA: Diagnosis not present

## 2013-12-28 DIAGNOSIS — I10 Essential (primary) hypertension: Secondary | ICD-10-CM | POA: Diagnosis not present

## 2013-12-28 DIAGNOSIS — E785 Hyperlipidemia, unspecified: Secondary | ICD-10-CM | POA: Diagnosis not present

## 2013-12-29 ENCOUNTER — Ambulatory Visit (INDEPENDENT_AMBULATORY_CARE_PROVIDER_SITE_OTHER): Payer: Medicare Other | Admitting: Pharmacist Clinician (PhC)/ Clinical Pharmacy Specialist

## 2013-12-29 VITALS — BP 108/76 | HR 84

## 2013-12-29 DIAGNOSIS — Z7901 Long term (current) use of anticoagulants: Secondary | ICD-10-CM

## 2013-12-29 DIAGNOSIS — I4891 Unspecified atrial fibrillation: Secondary | ICD-10-CM

## 2013-12-29 LAB — POCT INR: INR: 2.1

## 2014-01-04 DIAGNOSIS — J309 Allergic rhinitis, unspecified: Secondary | ICD-10-CM | POA: Diagnosis not present

## 2014-01-04 DIAGNOSIS — Z0000 Encounter for general adult medical examination without abnormal findings: Secondary | ICD-10-CM | POA: Diagnosis not present

## 2014-01-04 DIAGNOSIS — I10 Essential (primary) hypertension: Secondary | ICD-10-CM | POA: Diagnosis not present

## 2014-01-04 DIAGNOSIS — R42 Dizziness and giddiness: Secondary | ICD-10-CM | POA: Diagnosis not present

## 2014-01-04 DIAGNOSIS — R82998 Other abnormal findings in urine: Secondary | ICD-10-CM | POA: Diagnosis not present

## 2014-01-04 DIAGNOSIS — E785 Hyperlipidemia, unspecified: Secondary | ICD-10-CM | POA: Diagnosis not present

## 2014-01-04 DIAGNOSIS — R3129 Other microscopic hematuria: Secondary | ICD-10-CM | POA: Diagnosis not present

## 2014-01-04 DIAGNOSIS — Z1331 Encounter for screening for depression: Secondary | ICD-10-CM | POA: Diagnosis not present

## 2014-01-04 DIAGNOSIS — I4891 Unspecified atrial fibrillation: Secondary | ICD-10-CM | POA: Diagnosis not present

## 2014-01-04 DIAGNOSIS — R7301 Impaired fasting glucose: Secondary | ICD-10-CM | POA: Diagnosis not present

## 2014-01-05 DIAGNOSIS — Z1212 Encounter for screening for malignant neoplasm of rectum: Secondary | ICD-10-CM | POA: Diagnosis not present

## 2014-01-27 ENCOUNTER — Telehealth: Payer: Self-pay | Admitting: Pharmacist Clinician (PhC)/ Clinical Pharmacy Specialist

## 2014-01-27 NOTE — Telephone Encounter (Signed)
Pt called with question,   Returned call - wanted to know if could drink cranberry juice for UTI.   Advised against this, pt voiced understanding

## 2014-02-08 ENCOUNTER — Other Ambulatory Visit: Payer: Self-pay | Admitting: Internal Medicine

## 2014-02-08 DIAGNOSIS — R82998 Other abnormal findings in urine: Secondary | ICD-10-CM | POA: Diagnosis not present

## 2014-02-08 DIAGNOSIS — N390 Urinary tract infection, site not specified: Secondary | ICD-10-CM

## 2014-02-09 ENCOUNTER — Ambulatory Visit (INDEPENDENT_AMBULATORY_CARE_PROVIDER_SITE_OTHER): Payer: Medicare Other | Admitting: Pharmacist Clinician (PhC)/ Clinical Pharmacy Specialist

## 2014-02-09 DIAGNOSIS — I4891 Unspecified atrial fibrillation: Secondary | ICD-10-CM

## 2014-02-09 DIAGNOSIS — Z7901 Long term (current) use of anticoagulants: Secondary | ICD-10-CM

## 2014-02-09 LAB — POCT INR: INR: 2.2

## 2014-02-14 ENCOUNTER — Ambulatory Visit
Admission: RE | Admit: 2014-02-14 | Discharge: 2014-02-14 | Disposition: A | Discharge status: Home / Self Care | Payer: Medicare Other | Source: Ambulatory Visit | Attending: Internal Medicine | Admitting: Internal Medicine

## 2014-02-14 DIAGNOSIS — N390 Urinary tract infection, site not specified: Secondary | ICD-10-CM

## 2014-02-14 DIAGNOSIS — K7689 Other specified diseases of liver: Secondary | ICD-10-CM | POA: Diagnosis not present

## 2014-03-09 DIAGNOSIS — S1093XA Contusion of unspecified part of neck, initial encounter: Secondary | ICD-10-CM | POA: Diagnosis not present

## 2014-03-09 DIAGNOSIS — S0003XA Contusion of scalp, initial encounter: Secondary | ICD-10-CM | POA: Diagnosis not present

## 2014-03-09 DIAGNOSIS — W1809XA Striking against other object with subsequent fall, initial encounter: Secondary | ICD-10-CM | POA: Diagnosis not present

## 2014-03-23 ENCOUNTER — Ambulatory Visit (INDEPENDENT_AMBULATORY_CARE_PROVIDER_SITE_OTHER): Payer: Medicare Other | Admitting: Pharmacist Clinician (PhC)/ Clinical Pharmacy Specialist

## 2014-03-23 VITALS — BP 138/90 | HR 76

## 2014-03-23 DIAGNOSIS — Z7901 Long term (current) use of anticoagulants: Secondary | ICD-10-CM | POA: Diagnosis not present

## 2014-03-23 DIAGNOSIS — I4891 Unspecified atrial fibrillation: Secondary | ICD-10-CM | POA: Diagnosis not present

## 2014-03-23 LAB — POCT INR: INR: 2.1

## 2014-05-04 ENCOUNTER — Ambulatory Visit (INDEPENDENT_AMBULATORY_CARE_PROVIDER_SITE_OTHER): Payer: Medicare Other | Admitting: Pharmacist Clinician (PhC)/ Clinical Pharmacy Specialist

## 2014-05-04 DIAGNOSIS — Z7901 Long term (current) use of anticoagulants: Secondary | ICD-10-CM | POA: Diagnosis not present

## 2014-05-04 DIAGNOSIS — I4891 Unspecified atrial fibrillation: Secondary | ICD-10-CM | POA: Diagnosis not present

## 2014-05-04 LAB — POCT INR: INR: 2.5

## 2014-05-31 ENCOUNTER — Other Ambulatory Visit: Payer: Self-pay | Admitting: Pharmacist Clinician (PhC)/ Clinical Pharmacy Specialist

## 2014-05-31 NOTE — Telephone Encounter (Signed)
Rx was sent to pharmacy electronically. 

## 2014-06-03 ENCOUNTER — Telehealth: Payer: Self-pay | Admitting: Pharmacist Clinician (PhC)/ Clinical Pharmacy Specialist

## 2014-06-03 NOTE — Telephone Encounter (Signed)
Called [facility] to okay manufacturer change of Warfarin.

## 2014-06-03 NOTE — Telephone Encounter (Signed)
RVU#-[id number] -Please call regarding manufacture change in her Warfarin

## 2014-06-15 ENCOUNTER — Ambulatory Visit (INDEPENDENT_AMBULATORY_CARE_PROVIDER_SITE_OTHER): Payer: Medicare Other | Admitting: Pharmacist Clinician (PhC)/ Clinical Pharmacy Specialist

## 2014-06-15 DIAGNOSIS — Z7901 Long term (current) use of anticoagulants: Secondary | ICD-10-CM | POA: Diagnosis not present

## 2014-06-15 DIAGNOSIS — I4891 Unspecified atrial fibrillation: Secondary | ICD-10-CM | POA: Diagnosis not present

## 2014-06-15 LAB — POCT INR: INR: 2.5

## 2014-06-24 ENCOUNTER — Ambulatory Visit (INDEPENDENT_AMBULATORY_CARE_PROVIDER_SITE_OTHER): Payer: Medicare Other | Admitting: General Surgery

## 2014-06-24 VITALS — BP 124/70 | HR 76 | Temp 97.0°F | Ht 68.0 in | Wt 197.0 lb

## 2014-06-24 DIAGNOSIS — D249 Benign neoplasm of unspecified breast: Secondary | ICD-10-CM | POA: Diagnosis not present

## 2014-06-24 DIAGNOSIS — D241 Benign neoplasm of right breast: Secondary | ICD-10-CM

## 2014-06-24 NOTE — Patient Instructions (Signed)
Continue regular self exams  

## 2014-06-27 NOTE — Progress Notes (Signed)
Subjective:     Patient ID: Sheena Benson, female   DOB: 1929/10/07, 80 y.o.   MRN: [id number]  HPI The patient is an 80 year old white female who we have been following for 2-1/2 years for a right-sided intraductal papilloma. She initially had nipple discharge on the right side but after her last biopsy this has disappeared. She denies any breast pain or discharge. She otherwise has no complaints.  Review of Systems  Constitutional: Negative.   HENT: Negative.   Eyes: Negative.   Respiratory: Negative.   Cardiovascular: Negative.   Gastrointestinal: Negative.   Endocrine: Negative.   Genitourinary: Negative.   Musculoskeletal: Negative.   Skin: Negative.   Allergic/Immunologic: Negative.   Neurological: Negative.   Hematological: Negative.   Psychiatric/Behavioral: Negative.        Objective:   Physical Exam  Constitutional: She is oriented to person, place, and time. She appears well-developed and well-nourished.  HENT:  Head: Normocephalic and atraumatic.  Eyes: Conjunctivae and EOM are normal. Pupils are equal, round, and reactive to light.  Neck: Normal range of motion. Neck supple.  Cardiovascular: Normal rate, regular rhythm and normal heart sounds.   Pulmonary/Chest: Effort normal and breath sounds normal.  There is no palpable mass in either breast. There is no palpable axillary, supraclavicular, or cervical lymphadenopathy.  Abdominal: Soft. Bowel sounds are normal.  Musculoskeletal: Normal range of motion.  Lymphadenopathy:    She has no cervical adenopathy.  Neurological: She is alert and oriented to person, place, and time.  Skin: Skin is warm and dry.  Psychiatric: She has a normal mood and affect. Her behavior is normal.       Assessment:     The patient is Two-year's status post biopsy of a right breast papilloma     Plan:     At this point she will continue to do regular self exams. She is due for her next mammogram in October. We will see her after  this is done.

## 2014-07-07 DIAGNOSIS — H353190 Nonexudative age-related macular degeneration, unspecified eye, stage unspecified: Secondary | ICD-10-CM | POA: Diagnosis not present

## 2014-07-27 ENCOUNTER — Ambulatory Visit (INDEPENDENT_AMBULATORY_CARE_PROVIDER_SITE_OTHER): Payer: Medicare Other | Admitting: Pharmacist Clinician (PhC)/ Clinical Pharmacy Specialist

## 2014-07-27 DIAGNOSIS — I4891 Unspecified atrial fibrillation: Secondary | ICD-10-CM | POA: Diagnosis not present

## 2014-07-27 DIAGNOSIS — Z7901 Long term (current) use of anticoagulants: Secondary | ICD-10-CM

## 2014-07-27 LAB — POCT INR: INR: 2.2

## 2014-08-17 DIAGNOSIS — Z23 Encounter for immunization: Secondary | ICD-10-CM | POA: Diagnosis not present

## 2014-09-06 ENCOUNTER — Other Ambulatory Visit: Payer: Self-pay | Admitting: Pharmacist Clinician (PhC)/ Clinical Pharmacy Specialist

## 2014-09-07 ENCOUNTER — Ambulatory Visit (INDEPENDENT_AMBULATORY_CARE_PROVIDER_SITE_OTHER): Payer: Medicare Other | Admitting: Pharmacist Clinician (PhC)/ Clinical Pharmacy Specialist

## 2014-09-07 DIAGNOSIS — Z7901 Long term (current) use of anticoagulants: Secondary | ICD-10-CM | POA: Diagnosis not present

## 2014-09-07 DIAGNOSIS — I4891 Unspecified atrial fibrillation: Secondary | ICD-10-CM

## 2014-09-07 LAB — POCT INR: INR: 2.6

## 2014-10-19 ENCOUNTER — Ambulatory Visit (INDEPENDENT_AMBULATORY_CARE_PROVIDER_SITE_OTHER): Payer: Medicare Other | Admitting: Pharmacist Clinician (PhC)/ Clinical Pharmacy Specialist

## 2014-10-19 DIAGNOSIS — Z7901 Long term (current) use of anticoagulants: Secondary | ICD-10-CM | POA: Diagnosis not present

## 2014-10-19 DIAGNOSIS — I4891 Unspecified atrial fibrillation: Secondary | ICD-10-CM | POA: Diagnosis not present

## 2014-10-19 LAB — POCT INR: INR: 2.4

## 2014-10-26 ENCOUNTER — Other Ambulatory Visit: Payer: Self-pay

## 2014-10-26 DIAGNOSIS — Z1231 Encounter for screening mammogram for malignant neoplasm of breast: Secondary | ICD-10-CM

## 2014-10-27 DIAGNOSIS — J302 Other seasonal allergic rhinitis: Secondary | ICD-10-CM | POA: Diagnosis not present

## 2014-10-27 DIAGNOSIS — Z6829 Body mass index (BMI) 29.0-29.9, adult: Secondary | ICD-10-CM | POA: Diagnosis not present

## 2014-10-27 DIAGNOSIS — R05 Cough: Secondary | ICD-10-CM | POA: Diagnosis not present

## 2014-11-01 ENCOUNTER — Ambulatory Visit (INDEPENDENT_AMBULATORY_CARE_PROVIDER_SITE_OTHER): Payer: Medicare Other | Admitting: Internal Medicine

## 2014-11-01 ENCOUNTER — Encounter: Payer: Self-pay | Admitting: Internal Medicine

## 2014-11-01 VITALS — BP 130/72 | HR 95 | Ht 68.0 in | Wt 193.1 lb

## 2014-11-01 DIAGNOSIS — Z7901 Long term (current) use of anticoagulants: Secondary | ICD-10-CM | POA: Diagnosis not present

## 2014-11-01 DIAGNOSIS — I482 Chronic atrial fibrillation, unspecified: Secondary | ICD-10-CM

## 2014-11-01 DIAGNOSIS — I10 Essential (primary) hypertension: Secondary | ICD-10-CM | POA: Diagnosis not present

## 2014-11-01 NOTE — Patient Instructions (Signed)
Dr.Hilty wants you to follow-up in: ONE YEAR. You will receive a reminder letter in the mail two months in advance. If you don't receive a letter, please call our office to schedule the follow-up appointment.  

## 2014-11-01 NOTE — Progress Notes (Signed)
OFFICE NOTE  Chief Complaint:  No complaints  Primary Care Physician: Jerlyn Ly, MD  HPI:  Sheena Benson is an 80 year old female now whom I have been following for atrial fibrillation which is persistent, is not chronic. She had a TEE cardioversion which restored sinus but quickly went back into AFib, was tried on Multaq and was intolerant of that, and actually was not really certain that she was aware of her AFib. Then we decided to pursue rate control and she has done very well with that. She has been therapeutic in her INRs on Coumadin without any bleeding problems. She is without complaints. Her INR was checked today and it was 1.9.  Sheena Benson returns today for follow-up. She is having no complaints. Her INR has been well controlled and followed by Erasmo Downer, our anticoagulation pharmacist. Recently she had a upper respiratory infection and required a Z-Pak as well as prednisone and Tessalon Perles. She still has some cough but is improving.  PMHx:  Past Medical History  Diagnosis Date  . Atrial fibrillation     intolerant to Multaq  . Hyperlipidemia   . Hypertension   . Uterine cancer 1964  . CAD (coronary artery disease)     mild  . History of nuclear stress test 08/2010    dipyridamole; mild ischemia in mid anterior & apical anterior regions; post-stress EF 72%    Past Surgical History  Procedure Laterality Date  . Fracture surgery      foot  . Tee with cardioversion  08/07/2010    EF 55-60%; mild MR; SR back into AF (Dr. Raliegh Ip. [place])   . Abdominal hysterectomy  1964    FAMHx:  Family History  Problem Relation Age of Onset  . Heart disease Mother     SOCHx:   reports that she has never smoked. She has never used smokeless tobacco. She reports that she does not drink alcohol or use illicit drugs.  ALLERGIES:  No Known Allergies  ROS: A comprehensive review of systems was negative except for: Respiratory: positive for cough  HOME MEDS: Current  Outpatient Prescriptions  Medication Sig Dispense Refill  . ergocalciferol (VITAMIN D2) 50000 UNITS capsule Take 50,000 Units by mouth 2 (two) times a week.     Marland Kitchen lisinopril (PRINIVIL,ZESTRIL) 10 MG tablet Take 1 tablet by mouth every morning.    . Multiple Vitamins-Minerals (MULTIVITAMIN WITH MINERALS) tablet Take 1 tablet by mouth daily.    . simvastatin (ZOCOR) 10 MG tablet Take 10 mg by mouth daily.     . TOPROL XL 25 MG 24 hr tablet Take 25 mg by mouth daily.     Marland Kitchen warfarin (COUMADIN) 2.5 MG tablet TAKE 1 TABLET ONCE DAILY AS DIRECTED 90 tablet 2  . benzonatate (TESSALON) 200 MG capsule as needed.     No current facility-administered medications for this visit.    LABS/IMAGING: No results found for this or any previous visit (from the past 48 hour(s)). No results found.  VITALS: BP 130/72 mmHg  Pulse 95  Ht 5\' 8"  (1.727 m)  Wt 193 lb 1.6 oz (87.59 kg)  BMI 29.37 kg/m2  EXAM: General appearance: alert and no distress Neck: no carotid bruit, no JVD and thyroid not enlarged, symmetric, no tenderness/mass/nodules Lungs: clear to auscultation bilaterally Heart: irregularly irregular rhythm  EKG: Atrial fibrillation with controlled ventricular response of 95  ASSESSMENT: 1. Chronic atrial fibrillation 2. Hypertension - controlled 3. Chronic anticoagulation  PLAN: 1.   Sheena Benson  is not having issues with her atrial fibrillation. At this point it chronic if not permanent. Her blood pressure is well controlled. She recently had upper respiratory infection but that is resolving although she has some annoying cough which can last up to a month. Otherwise she is doing well. Plan to see her back annually or sooner as necessary. She will continue have INR checks every 4-6 weeks through our Coumadin clinic.  [name], MD, [facility] Attending Cardiologist [facility]  HILTY,Kenneth C 11/01/2014, 4:24 PM

## 2014-11-21 ENCOUNTER — Ambulatory Visit
Admission: RE | Admit: 2014-11-21 | Discharge: 2014-11-21 | Disposition: A | Discharge status: Home / Self Care | Payer: Medicare Other | Source: Ambulatory Visit

## 2014-11-21 DIAGNOSIS — Z1231 Encounter for screening mammogram for malignant neoplasm of breast: Secondary | ICD-10-CM | POA: Diagnosis not present

## 2014-11-30 ENCOUNTER — Ambulatory Visit (INDEPENDENT_AMBULATORY_CARE_PROVIDER_SITE_OTHER): Payer: Medicare Other | Admitting: Pharmacist Clinician (PhC)/ Clinical Pharmacy Specialist

## 2014-11-30 DIAGNOSIS — Z7901 Long term (current) use of anticoagulants: Secondary | ICD-10-CM | POA: Diagnosis not present

## 2014-11-30 DIAGNOSIS — I4891 Unspecified atrial fibrillation: Secondary | ICD-10-CM | POA: Diagnosis not present

## 2014-11-30 LAB — POCT INR: INR: 2.2

## 2014-12-20 DIAGNOSIS — M859 Disorder of bone density and structure, unspecified: Secondary | ICD-10-CM | POA: Diagnosis not present

## 2015-01-03 DIAGNOSIS — Z008 Encounter for other general examination: Secondary | ICD-10-CM | POA: Diagnosis not present

## 2015-01-03 DIAGNOSIS — E559 Vitamin D deficiency, unspecified: Secondary | ICD-10-CM | POA: Diagnosis not present

## 2015-01-03 DIAGNOSIS — R358 Other polyuria: Secondary | ICD-10-CM | POA: Diagnosis not present

## 2015-01-03 DIAGNOSIS — R7301 Impaired fasting glucose: Secondary | ICD-10-CM | POA: Diagnosis not present

## 2015-01-03 DIAGNOSIS — E785 Hyperlipidemia, unspecified: Secondary | ICD-10-CM | POA: Diagnosis not present

## 2015-01-03 DIAGNOSIS — M8580 Other specified disorders of bone density and structure, unspecified site: Secondary | ICD-10-CM | POA: Diagnosis not present

## 2015-01-03 DIAGNOSIS — I10 Essential (primary) hypertension: Secondary | ICD-10-CM | POA: Diagnosis not present

## 2015-01-05 DIAGNOSIS — H43813 Vitreous degeneration, bilateral: Secondary | ICD-10-CM | POA: Diagnosis not present

## 2015-01-05 DIAGNOSIS — H35372 Puckering of macula, left eye: Secondary | ICD-10-CM | POA: Diagnosis not present

## 2015-01-05 DIAGNOSIS — H3531 Nonexudative age-related macular degeneration: Secondary | ICD-10-CM | POA: Diagnosis not present

## 2015-01-10 DIAGNOSIS — E785 Hyperlipidemia, unspecified: Secondary | ICD-10-CM | POA: Diagnosis not present

## 2015-01-10 DIAGNOSIS — Z1389 Encounter for screening for other disorder: Secondary | ICD-10-CM | POA: Diagnosis not present

## 2015-01-10 DIAGNOSIS — R312 Other microscopic hematuria: Secondary | ICD-10-CM | POA: Diagnosis not present

## 2015-01-10 DIAGNOSIS — Z0000 Encounter for general adult medical examination without abnormal findings: Secondary | ICD-10-CM | POA: Diagnosis not present

## 2015-01-10 DIAGNOSIS — M8580 Other specified disorders of bone density and structure, unspecified site: Secondary | ICD-10-CM | POA: Diagnosis not present

## 2015-01-10 DIAGNOSIS — J302 Other seasonal allergic rhinitis: Secondary | ICD-10-CM | POA: Diagnosis not present

## 2015-01-10 DIAGNOSIS — R7301 Impaired fasting glucose: Secondary | ICD-10-CM | POA: Diagnosis not present

## 2015-01-10 DIAGNOSIS — I10 Essential (primary) hypertension: Secondary | ICD-10-CM | POA: Diagnosis not present

## 2015-01-10 DIAGNOSIS — Z6829 Body mass index (BMI) 29.0-29.9, adult: Secondary | ICD-10-CM | POA: Diagnosis not present

## 2015-01-10 DIAGNOSIS — Z1212 Encounter for screening for malignant neoplasm of rectum: Secondary | ICD-10-CM | POA: Diagnosis not present

## 2015-01-10 DIAGNOSIS — I480 Paroxysmal atrial fibrillation: Secondary | ICD-10-CM | POA: Diagnosis not present

## 2015-01-11 ENCOUNTER — Ambulatory Visit (INDEPENDENT_AMBULATORY_CARE_PROVIDER_SITE_OTHER): Payer: Medicare Other | Admitting: Pharmacist Clinician (PhC)/ Clinical Pharmacy Specialist

## 2015-01-11 ENCOUNTER — Encounter: Payer: Self-pay | Admitting: Internal Medicine

## 2015-01-11 DIAGNOSIS — I4891 Unspecified atrial fibrillation: Secondary | ICD-10-CM

## 2015-01-11 DIAGNOSIS — Z7901 Long term (current) use of anticoagulants: Secondary | ICD-10-CM

## 2015-01-11 LAB — POCT INR: INR: 2.1

## 2015-02-15 DIAGNOSIS — D241 Benign neoplasm of right breast: Secondary | ICD-10-CM | POA: Diagnosis not present

## 2015-02-22 ENCOUNTER — Other Ambulatory Visit: Payer: Self-pay | Admitting: *Deleted

## 2015-02-22 MED ORDER — WARFARIN SODIUM 2.5 MG PO TABS: ORAL_TABLET | ORAL | Status: DC

## 2015-02-22 NOTE — Telephone Encounter (Signed)
Rx(s) sent to pharmacy electronically.  

## 2015-03-01 ENCOUNTER — Ambulatory Visit (INDEPENDENT_AMBULATORY_CARE_PROVIDER_SITE_OTHER): Payer: Medicare Other | Admitting: Pharmacist Clinician (PhC)/ Clinical Pharmacy Specialist

## 2015-03-01 DIAGNOSIS — I4891 Unspecified atrial fibrillation: Secondary | ICD-10-CM | POA: Diagnosis not present

## 2015-03-01 DIAGNOSIS — Z7901 Long term (current) use of anticoagulants: Secondary | ICD-10-CM

## 2015-03-01 LAB — POCT INR: INR: 2.4

## 2015-04-12 ENCOUNTER — Ambulatory Visit (INDEPENDENT_AMBULATORY_CARE_PROVIDER_SITE_OTHER): Payer: Medicare Other | Admitting: Pharmacist Clinician (PhC)/ Clinical Pharmacy Specialist

## 2015-04-12 DIAGNOSIS — Z7901 Long term (current) use of anticoagulants: Secondary | ICD-10-CM | POA: Diagnosis not present

## 2015-04-12 DIAGNOSIS — I4891 Unspecified atrial fibrillation: Secondary | ICD-10-CM

## 2015-04-12 LAB — POCT INR: INR: 2.4

## 2015-05-24 ENCOUNTER — Ambulatory Visit (INDEPENDENT_AMBULATORY_CARE_PROVIDER_SITE_OTHER): Payer: Medicare Other | Admitting: Pharmacist Clinician (PhC)/ Clinical Pharmacy Specialist

## 2015-05-24 DIAGNOSIS — Z7901 Long term (current) use of anticoagulants: Secondary | ICD-10-CM

## 2015-05-24 DIAGNOSIS — I4891 Unspecified atrial fibrillation: Secondary | ICD-10-CM | POA: Diagnosis not present

## 2015-05-24 LAB — POCT INR: INR: 2.2

## 2015-05-24 MED ORDER — WARFARIN SODIUM 2.5 MG PO TABS: ORAL_TABLET | ORAL | Status: DC

## 2015-07-04 DIAGNOSIS — H3531 Nonexudative age-related macular degeneration: Secondary | ICD-10-CM | POA: Diagnosis not present

## 2015-07-04 DIAGNOSIS — H43813 Vitreous degeneration, bilateral: Secondary | ICD-10-CM | POA: Diagnosis not present

## 2015-07-05 ENCOUNTER — Ambulatory Visit (INDEPENDENT_AMBULATORY_CARE_PROVIDER_SITE_OTHER): Payer: Medicare Other | Admitting: Pharmacist Clinician (PhC)/ Clinical Pharmacy Specialist

## 2015-07-05 DIAGNOSIS — I4891 Unspecified atrial fibrillation: Secondary | ICD-10-CM | POA: Diagnosis not present

## 2015-07-05 DIAGNOSIS — Z7901 Long term (current) use of anticoagulants: Secondary | ICD-10-CM | POA: Diagnosis not present

## 2015-07-05 LAB — POCT INR: INR: 2

## 2015-08-16 ENCOUNTER — Ambulatory Visit (INDEPENDENT_AMBULATORY_CARE_PROVIDER_SITE_OTHER): Payer: Medicare Other | Admitting: Pharmacist Clinician (PhC)/ Clinical Pharmacy Specialist

## 2015-08-16 DIAGNOSIS — I4891 Unspecified atrial fibrillation: Secondary | ICD-10-CM

## 2015-08-16 DIAGNOSIS — Z7901 Long term (current) use of anticoagulants: Secondary | ICD-10-CM

## 2015-08-16 LAB — POCT INR: INR: 2.5

## 2015-09-15 DIAGNOSIS — Z23 Encounter for immunization: Secondary | ICD-10-CM | POA: Diagnosis not present

## 2015-09-27 ENCOUNTER — Ambulatory Visit (INDEPENDENT_AMBULATORY_CARE_PROVIDER_SITE_OTHER): Payer: Medicare Other | Admitting: Pharmacist Clinician (PhC)/ Clinical Pharmacy Specialist

## 2015-09-27 DIAGNOSIS — Z7901 Long term (current) use of anticoagulants: Secondary | ICD-10-CM

## 2015-09-27 DIAGNOSIS — I4891 Unspecified atrial fibrillation: Secondary | ICD-10-CM | POA: Diagnosis not present

## 2015-09-27 LAB — POCT INR: INR: 2.2

## 2015-10-04 ENCOUNTER — Other Ambulatory Visit: Payer: Self-pay

## 2015-10-04 DIAGNOSIS — Z1231 Encounter for screening mammogram for malignant neoplasm of breast: Secondary | ICD-10-CM

## 2015-11-08 ENCOUNTER — Ambulatory Visit (INDEPENDENT_AMBULATORY_CARE_PROVIDER_SITE_OTHER): Payer: Medicare Other | Admitting: Pharmacist Clinician (PhC)/ Clinical Pharmacy Specialist

## 2015-11-08 DIAGNOSIS — I4891 Unspecified atrial fibrillation: Secondary | ICD-10-CM | POA: Diagnosis not present

## 2015-11-08 DIAGNOSIS — Z7901 Long term (current) use of anticoagulants: Secondary | ICD-10-CM

## 2015-11-08 LAB — POCT INR: INR: 2

## 2015-11-23 ENCOUNTER — Ambulatory Visit
Admission: RE | Admit: 2015-11-23 | Discharge: 2015-11-23 | Disposition: A | Discharge status: Home / Self Care | Payer: Medicare Other | Source: Ambulatory Visit

## 2015-11-23 DIAGNOSIS — Z1231 Encounter for screening mammogram for malignant neoplasm of breast: Secondary | ICD-10-CM

## 2015-12-20 ENCOUNTER — Encounter: Payer: Medicare Other | Admitting: Pharmacist Clinician (PhC)/ Clinical Pharmacy Specialist

## 2015-12-26 ENCOUNTER — Ambulatory Visit (INDEPENDENT_AMBULATORY_CARE_PROVIDER_SITE_OTHER): Payer: Medicare Other | Admitting: Pharmacist Clinician (PhC)/ Clinical Pharmacy Specialist

## 2015-12-26 ENCOUNTER — Encounter: Payer: Self-pay | Admitting: Internal Medicine

## 2015-12-26 ENCOUNTER — Ambulatory Visit (INDEPENDENT_AMBULATORY_CARE_PROVIDER_SITE_OTHER): Payer: Medicare Other | Admitting: Internal Medicine

## 2015-12-26 VITALS — BP 122/92 | HR 79 | Ht 68.5 in | Wt 196.2 lb

## 2015-12-26 DIAGNOSIS — I10 Essential (primary) hypertension: Secondary | ICD-10-CM

## 2015-12-26 DIAGNOSIS — I4821 Permanent atrial fibrillation: Secondary | ICD-10-CM

## 2015-12-26 DIAGNOSIS — Z7901 Long term (current) use of anticoagulants: Secondary | ICD-10-CM

## 2015-12-26 DIAGNOSIS — I4891 Unspecified atrial fibrillation: Secondary | ICD-10-CM

## 2015-12-26 DIAGNOSIS — I482 Chronic atrial fibrillation: Secondary | ICD-10-CM | POA: Diagnosis not present

## 2015-12-26 LAB — POCT INR: INR: 2.9

## 2015-12-26 NOTE — Patient Instructions (Signed)
Dr Hilty recommends that you schedule a follow-up appointment in 1 year. You will receive a reminder letter in the mail two months in advance. If you don't receive a letter, please call our office to schedule the follow-up appointment.  If you need a refill on your cardiac medications before your next appointment, please call your pharmacy. 

## 2015-12-26 NOTE — Progress Notes (Signed)
OFFICE NOTE  Chief Complaint:  No complaints  Primary Care Physician: Jerlyn Ly, MD  HPI:  Sheena Benson is an 82 year old female now whom I have been following for atrial fibrillation which is persistent, is not chronic. She had a TEE cardioversion which restored sinus but quickly went back into AFib, was tried on Multaq and was intolerant of that, and actually was not really certain that she was aware of her AFib. Then we decided to pursue rate control and she has done very well with that. She has been therapeutic in her INRs on Coumadin without any bleeding problems. She is without complaints. Her INR was checked today and it was 1.9.  Sheena Benson returns today for follow-up. She is having no complaints. Her INR has been well controlled and followed by Erasmo Downer, our anticoagulation pharmacist. Recently she had a upper respiratory infection and required a Z-Pak as well as prednisone and Tessalon Perles. She still has some cough but is improving.  Sheena Benson returns today for follow-up. Overall she's doing well. She denies any chest pain or worsening shortness of breath. INR has been stable. She remains in A. fib which is rate controlled. Blood pressures is at goal.  PMHx:  Past Medical History  Diagnosis Date  . Atrial fibrillation ([place])     intolerant to Multaq  . Hyperlipidemia   . Hypertension   . Uterine cancer ([place]) 1964  . CAD (coronary artery disease)     mild  . History of nuclear stress test 08/2010    dipyridamole; mild ischemia in mid anterior & apical anterior regions; post-stress EF 72%    Past Surgical History  Procedure Laterality Date  . Fracture surgery      foot  . Tee with cardioversion  08/07/2010    EF 55-60%; mild MR; SR back into AF (Dr. Raliegh Ip. [place] Jovita Persing)   . Abdominal hysterectomy  1964    FAMHx:  Family History  Problem Relation Age of Onset  . Heart disease Mother     SOCHx:   reports that she has never smoked. She has never used smokeless  tobacco. She reports that she does not drink alcohol or use illicit drugs.  ALLERGIES:  No Known Allergies  ROS: A comprehensive review of systems was negative.  HOME MEDS: Current Outpatient Prescriptions  Medication Sig Dispense Refill  . benzonatate (TESSALON) 200 MG capsule as needed.    . ergocalciferol (VITAMIN D2) 50000 UNITS capsule Take 50,000 Units by mouth 2 (two) times a week.     Marland Kitchen lisinopril (PRINIVIL,ZESTRIL) 20 MG tablet Take 20 mg by mouth daily.  11  . meclizine (ANTIVERT) 25 MG tablet Take 1 tablet by mouth 3 (three) times daily as needed.  1  . Multiple Vitamins-Minerals (MULTIVITAMIN WITH MINERALS) tablet Take 1 tablet by mouth daily.    . simvastatin (ZOCOR) 10 MG tablet Take 10 mg by mouth daily.     . TOPROL XL 25 MG 24 hr tablet Take 25 mg by mouth daily.     Marland Kitchen warfarin (COUMADIN) 2.5 MG tablet TAKE 1 TABLET ONCE DAILY AS DIRECTED 90 tablet 1   No current facility-administered medications for this visit.    LABS/IMAGING: Results for orders placed or performed in visit on 12/26/15 (from the past 48 hour(s))  POCT INR     Status: None   Collection Time: 12/26/15  4:20 PM  Result Value Ref Range   INR 2.9    No results found.  VITALS: BP  122/92 mmHg  Pulse 79  Ht 5' 8.5" (1.74 m)  Wt 196 lb 3.2 oz (88.996 kg)  BMI 29.39 kg/m2  EXAM: General appearance: alert and no distress Neck: no carotid bruit and no JVD Lungs: clear to auscultation bilaterally Heart: irregularly irregular rhythm Abdomen: soft, non-tender; bowel sounds normal; no masses,  no organomegaly Extremities: extremities normal, atraumatic, no cyanosis or edema Pulses: 2+ and symmetric Skin: Skin color, texture, turgor normal. No rashes or lesions  EKG: Atrial fibrillation with controlled ventricular response of 79  ASSESSMENT: 1. Chronic atrial fibrillation 2. Hypertension - controlled 3. Chronic anticoagulation  PLAN: 1.   Sheena Benson is not having issues with her atrial  fibrillation. At this point it chronic if not permanent. Her blood pressure is well controlled. INR has been stable. Ok to follow-up annually or sooner if necessary.  [name], MD, [facility] Attending Cardiologist [place] Alainna Stawicki 12/26/2015, 5:21 PM

## 2016-01-11 DIAGNOSIS — H353122 Nonexudative age-related macular degeneration, left eye, intermediate dry stage: Secondary | ICD-10-CM | POA: Diagnosis not present

## 2016-01-11 DIAGNOSIS — H353112 Nonexudative age-related macular degeneration, right eye, intermediate dry stage: Secondary | ICD-10-CM | POA: Diagnosis not present

## 2016-01-31 DIAGNOSIS — H26493 Other secondary cataract, bilateral: Secondary | ICD-10-CM | POA: Diagnosis not present

## 2016-01-31 DIAGNOSIS — H52203 Unspecified astigmatism, bilateral: Secondary | ICD-10-CM | POA: Diagnosis not present

## 2016-01-31 DIAGNOSIS — H353112 Nonexudative age-related macular degeneration, right eye, intermediate dry stage: Secondary | ICD-10-CM | POA: Diagnosis not present

## 2016-01-31 DIAGNOSIS — Z961 Presence of intraocular lens: Secondary | ICD-10-CM | POA: Diagnosis not present

## 2016-02-01 DIAGNOSIS — D241 Benign neoplasm of right breast: Secondary | ICD-10-CM | POA: Diagnosis not present

## 2016-02-06 ENCOUNTER — Ambulatory Visit (INDEPENDENT_AMBULATORY_CARE_PROVIDER_SITE_OTHER): Payer: Medicare Other | Admitting: Pharmacist Clinician (PhC)/ Clinical Pharmacy Specialist

## 2016-02-06 DIAGNOSIS — Z7901 Long term (current) use of anticoagulants: Secondary | ICD-10-CM

## 2016-02-06 DIAGNOSIS — I4891 Unspecified atrial fibrillation: Secondary | ICD-10-CM | POA: Diagnosis not present

## 2016-02-06 DIAGNOSIS — I482 Chronic atrial fibrillation: Secondary | ICD-10-CM

## 2016-02-06 DIAGNOSIS — I4821 Permanent atrial fibrillation: Secondary | ICD-10-CM

## 2016-02-06 LAB — POCT INR: INR: 2.6

## 2016-02-15 DIAGNOSIS — H26492 Other secondary cataract, left eye: Secondary | ICD-10-CM | POA: Diagnosis not present

## 2016-02-15 DIAGNOSIS — E784 Other hyperlipidemia: Secondary | ICD-10-CM | POA: Diagnosis not present

## 2016-02-15 DIAGNOSIS — E559 Vitamin D deficiency, unspecified: Secondary | ICD-10-CM | POA: Diagnosis not present

## 2016-02-15 DIAGNOSIS — N390 Urinary tract infection, site not specified: Secondary | ICD-10-CM | POA: Diagnosis not present

## 2016-02-15 DIAGNOSIS — R7301 Impaired fasting glucose: Secondary | ICD-10-CM | POA: Diagnosis not present

## 2016-02-15 DIAGNOSIS — I10 Essential (primary) hypertension: Secondary | ICD-10-CM | POA: Diagnosis not present

## 2016-02-15 DIAGNOSIS — R8299 Other abnormal findings in urine: Secondary | ICD-10-CM | POA: Diagnosis not present

## 2016-02-22 DIAGNOSIS — I10 Essential (primary) hypertension: Secondary | ICD-10-CM | POA: Diagnosis not present

## 2016-02-22 DIAGNOSIS — Z1389 Encounter for screening for other disorder: Secondary | ICD-10-CM | POA: Diagnosis not present

## 2016-02-22 DIAGNOSIS — R3129 Other microscopic hematuria: Secondary | ICD-10-CM | POA: Diagnosis not present

## 2016-02-22 DIAGNOSIS — Z0000 Encounter for general adult medical examination without abnormal findings: Secondary | ICD-10-CM | POA: Diagnosis not present

## 2016-02-22 DIAGNOSIS — M859 Disorder of bone density and structure, unspecified: Secondary | ICD-10-CM | POA: Diagnosis not present

## 2016-02-22 DIAGNOSIS — E784 Other hyperlipidemia: Secondary | ICD-10-CM | POA: Diagnosis not present

## 2016-02-22 DIAGNOSIS — I480 Paroxysmal atrial fibrillation: Secondary | ICD-10-CM | POA: Diagnosis not present

## 2016-02-22 DIAGNOSIS — Z1231 Encounter for screening mammogram for malignant neoplasm of breast: Secondary | ICD-10-CM | POA: Diagnosis not present

## 2016-02-22 DIAGNOSIS — Z6829 Body mass index (BMI) 29.0-29.9, adult: Secondary | ICD-10-CM | POA: Diagnosis not present

## 2016-02-22 DIAGNOSIS — E559 Vitamin D deficiency, unspecified: Secondary | ICD-10-CM | POA: Diagnosis not present

## 2016-02-22 DIAGNOSIS — M5380 Other specified dorsopathies, site unspecified: Secondary | ICD-10-CM | POA: Diagnosis not present

## 2016-02-22 DIAGNOSIS — R42 Dizziness and giddiness: Secondary | ICD-10-CM | POA: Diagnosis not present

## 2016-03-20 ENCOUNTER — Encounter: Payer: Medicare Other | Admitting: Pharmacist Clinician (PhC)/ Clinical Pharmacy Specialist

## 2016-03-21 ENCOUNTER — Ambulatory Visit (INDEPENDENT_AMBULATORY_CARE_PROVIDER_SITE_OTHER): Payer: Medicare Other | Admitting: Pharmacist Clinician (PhC)/ Clinical Pharmacy Specialist

## 2016-03-21 DIAGNOSIS — I482 Chronic atrial fibrillation: Secondary | ICD-10-CM

## 2016-03-21 DIAGNOSIS — I4891 Unspecified atrial fibrillation: Secondary | ICD-10-CM

## 2016-03-21 DIAGNOSIS — Z7901 Long term (current) use of anticoagulants: Secondary | ICD-10-CM

## 2016-03-21 DIAGNOSIS — I4821 Permanent atrial fibrillation: Secondary | ICD-10-CM

## 2016-03-21 LAB — POCT INR: INR: 2.8

## 2016-04-27 ENCOUNTER — Other Ambulatory Visit: Payer: Self-pay | Admitting: Internal Medicine

## 2016-05-01 ENCOUNTER — Ambulatory Visit (INDEPENDENT_AMBULATORY_CARE_PROVIDER_SITE_OTHER): Payer: Medicare Other | Admitting: Pharmacist Clinician (PhC)/ Clinical Pharmacy Specialist

## 2016-05-01 DIAGNOSIS — I482 Chronic atrial fibrillation: Secondary | ICD-10-CM | POA: Diagnosis not present

## 2016-05-01 DIAGNOSIS — Z7901 Long term (current) use of anticoagulants: Secondary | ICD-10-CM | POA: Diagnosis not present

## 2016-05-01 DIAGNOSIS — I4891 Unspecified atrial fibrillation: Secondary | ICD-10-CM

## 2016-05-01 DIAGNOSIS — I4821 Permanent atrial fibrillation: Secondary | ICD-10-CM

## 2016-05-01 LAB — POCT INR: INR: 2.2

## 2016-05-16 DIAGNOSIS — R42 Dizziness and giddiness: Secondary | ICD-10-CM | POA: Diagnosis not present

## 2016-05-16 DIAGNOSIS — I10 Essential (primary) hypertension: Secondary | ICD-10-CM | POA: Diagnosis not present

## 2016-05-16 DIAGNOSIS — Z6829 Body mass index (BMI) 29.0-29.9, adult: Secondary | ICD-10-CM | POA: Diagnosis not present

## 2016-06-12 ENCOUNTER — Ambulatory Visit (INDEPENDENT_AMBULATORY_CARE_PROVIDER_SITE_OTHER): Payer: Medicare Other | Admitting: Pharmacist

## 2016-06-12 DIAGNOSIS — I482 Chronic atrial fibrillation: Secondary | ICD-10-CM

## 2016-06-12 DIAGNOSIS — Z7901 Long term (current) use of anticoagulants: Secondary | ICD-10-CM

## 2016-06-12 DIAGNOSIS — I4821 Permanent atrial fibrillation: Secondary | ICD-10-CM

## 2016-06-12 DIAGNOSIS — I4891 Unspecified atrial fibrillation: Secondary | ICD-10-CM

## 2016-06-12 LAB — POCT INR: INR: 2.7

## 2016-07-03 DIAGNOSIS — H353121 Nonexudative age-related macular degeneration, left eye, early dry stage: Secondary | ICD-10-CM | POA: Diagnosis not present

## 2016-07-03 DIAGNOSIS — H353111 Nonexudative age-related macular degeneration, right eye, early dry stage: Secondary | ICD-10-CM | POA: Diagnosis not present

## 2016-07-31 ENCOUNTER — Ambulatory Visit (INDEPENDENT_AMBULATORY_CARE_PROVIDER_SITE_OTHER): Payer: Medicare Other | Admitting: Pharmacist Clinician (PhC)/ Clinical Pharmacy Specialist

## 2016-07-31 DIAGNOSIS — I482 Chronic atrial fibrillation: Secondary | ICD-10-CM | POA: Diagnosis not present

## 2016-07-31 DIAGNOSIS — I4821 Permanent atrial fibrillation: Secondary | ICD-10-CM

## 2016-07-31 DIAGNOSIS — Z7901 Long term (current) use of anticoagulants: Secondary | ICD-10-CM | POA: Diagnosis not present

## 2016-07-31 DIAGNOSIS — I4891 Unspecified atrial fibrillation: Secondary | ICD-10-CM | POA: Diagnosis not present

## 2016-07-31 LAB — POCT INR: INR: 2.3

## 2016-09-11 ENCOUNTER — Ambulatory Visit (INDEPENDENT_AMBULATORY_CARE_PROVIDER_SITE_OTHER): Payer: Medicare Other | Admitting: Pharmacist Clinician (PhC)/ Clinical Pharmacy Specialist

## 2016-09-11 DIAGNOSIS — I4891 Unspecified atrial fibrillation: Secondary | ICD-10-CM

## 2016-09-11 DIAGNOSIS — Z7901 Long term (current) use of anticoagulants: Secondary | ICD-10-CM

## 2016-09-11 DIAGNOSIS — I4821 Permanent atrial fibrillation: Secondary | ICD-10-CM

## 2016-09-11 DIAGNOSIS — I482 Chronic atrial fibrillation: Secondary | ICD-10-CM

## 2016-09-11 LAB — POCT INR: INR: 2.3

## 2016-09-18 DIAGNOSIS — Z23 Encounter for immunization: Secondary | ICD-10-CM | POA: Diagnosis not present

## 2016-10-15 ENCOUNTER — Other Ambulatory Visit: Payer: Self-pay | Admitting: Internal Medicine

## 2016-10-15 DIAGNOSIS — Z1231 Encounter for screening mammogram for malignant neoplasm of breast: Secondary | ICD-10-CM

## 2016-10-23 ENCOUNTER — Ambulatory Visit (INDEPENDENT_AMBULATORY_CARE_PROVIDER_SITE_OTHER): Payer: Medicare Other | Admitting: Pharmacist

## 2016-10-23 DIAGNOSIS — I4891 Unspecified atrial fibrillation: Secondary | ICD-10-CM | POA: Diagnosis not present

## 2016-10-23 DIAGNOSIS — I482 Chronic atrial fibrillation: Secondary | ICD-10-CM

## 2016-10-23 DIAGNOSIS — I4821 Permanent atrial fibrillation: Secondary | ICD-10-CM

## 2016-10-23 DIAGNOSIS — Z7901 Long term (current) use of anticoagulants: Secondary | ICD-10-CM

## 2016-10-23 LAB — POCT INR: INR: 2.1

## 2016-10-24 ENCOUNTER — Other Ambulatory Visit: Payer: Self-pay | Admitting: Internal Medicine

## 2016-12-02 ENCOUNTER — Ambulatory Visit: Payer: Medicare Other

## 2016-12-16 ENCOUNTER — Ambulatory Visit
Admission: RE | Admit: 2016-12-16 | Discharge: 2016-12-16 | Disposition: A | Discharge status: Home / Self Care | Payer: Medicare Other | Source: Ambulatory Visit | Attending: Internal Medicine | Admitting: Internal Medicine

## 2016-12-16 DIAGNOSIS — Z1231 Encounter for screening mammogram for malignant neoplasm of breast: Secondary | ICD-10-CM

## 2016-12-23 ENCOUNTER — Ambulatory Visit (INDEPENDENT_AMBULATORY_CARE_PROVIDER_SITE_OTHER): Payer: Medicare Other | Admitting: Pharmacist Clinician (PhC)/ Clinical Pharmacy Specialist

## 2016-12-23 DIAGNOSIS — I4821 Permanent atrial fibrillation: Secondary | ICD-10-CM

## 2016-12-23 DIAGNOSIS — I4891 Unspecified atrial fibrillation: Secondary | ICD-10-CM | POA: Diagnosis not present

## 2016-12-23 DIAGNOSIS — Z7901 Long term (current) use of anticoagulants: Secondary | ICD-10-CM | POA: Diagnosis not present

## 2016-12-23 DIAGNOSIS — I482 Chronic atrial fibrillation: Secondary | ICD-10-CM

## 2016-12-23 LAB — POCT INR: INR: 2.4

## 2016-12-31 ENCOUNTER — Ambulatory Visit (INDEPENDENT_AMBULATORY_CARE_PROVIDER_SITE_OTHER): Payer: Medicare Other | Admitting: Internal Medicine

## 2016-12-31 ENCOUNTER — Encounter: Payer: Self-pay | Admitting: Internal Medicine

## 2016-12-31 VITALS — BP 130/86 | HR 78 | Ht 68.5 in | Wt 197.0 lb

## 2016-12-31 DIAGNOSIS — I10 Essential (primary) hypertension: Secondary | ICD-10-CM | POA: Diagnosis not present

## 2016-12-31 DIAGNOSIS — I4821 Permanent atrial fibrillation: Secondary | ICD-10-CM

## 2016-12-31 DIAGNOSIS — Z7901 Long term (current) use of anticoagulants: Secondary | ICD-10-CM

## 2016-12-31 DIAGNOSIS — I482 Chronic atrial fibrillation: Secondary | ICD-10-CM | POA: Diagnosis not present

## 2016-12-31 NOTE — Progress Notes (Signed)
OFFICE NOTE  Chief Complaint:  No complaints  Primary Care Physician: Jerlyn Ly, MD  HPI:  Sheena Benson is an 83 year old female now whom I have been following for atrial fibrillation which is persistent, is not chronic. She had a TEE cardioversion which restored sinus but quickly went back into AFib, was tried on Multaq and was intolerant of that, and actually was not really certain that she was aware of her AFib. Then we decided to pursue rate control and she has done very well with that. She has been therapeutic in her INRs on Coumadin without any bleeding problems. She is without complaints. Her INR was checked today and it was 1.9.  [name] returns today for follow-up. She is having no complaints. Her INR has been well controlled and followed by Erasmo Downer, our anticoagulation pharmacist. Recently she had a upper respiratory infection and required a Z-Pak as well as prednisone and Tessalon Perles. She still has some cough but is improving.  [name] returns today for follow-up. Overall she's doing well. She denies any chest pain or worsening shortness of breath. INR has been stable. She remains in A. fib which is rate controlled. Blood pressures is at goal.  12/31/2016  [name] returns today for follow-up. Her last year she is without any complaints. She denies any chest pain or worsening shortness of breath. Her INR has been consistently at goal every 6 weeks. Today was 2.4 is due for a recheck his next month. Blood pressure is well controlled. Cholesterol is followed by her primary care provider. She remains in permanent atrial fibrillation.  PMHx:  Past Medical History:  Diagnosis Date  . Atrial fibrillation ([place])    intolerant to Multaq  . CAD (coronary artery disease)    mild  . History of nuclear stress test 08/2010   dipyridamole; mild ischemia in mid anterior & apical anterior regions; post-stress EF 72%  . Hyperlipidemia   . Hypertension   . Uterine cancer ([place]) 1964      Past Surgical History:  Procedure Laterality Date  . ABDOMINAL HYSTERECTOMY  1964  . FRACTURE SURGERY     foot  . TEE WITH CARDIOVERSION  08/07/2010   EF 55-60%; mild MR; SR back into AF (Dr. K. Mal[place] Jerrelle Michelsen)     FAMHx:  Family History  Problem Relation Age of Onset  . Heart disease Mother     SOCHx:   reports that she has never smoked. She has never used smokeless tobacco. She reports that she does not drink alcohol or use drugs.  ALLERGIES:  No Known Allergies  ROS: Pertinent items noted in HPI and remainder of comprehensive ROS otherwise negative.  HOME MEDS: Current Outpatient Prescriptions  Medication Sig Dispense Refill  . lisinopril (PRINIVIL,ZESTRIL) 20 MG tablet Take 20 mg by mouth daily.  11  . meclizine (ANTIVERT) 25 MG tablet Take 1 tablet by mouth 3 (three) times daily as needed.  1  . Multiple Vitamins-Minerals (MULTIVITAMIN WITH MINERALS) tablet Take 1 tablet by mouth daily.    . simvastatin (ZOCOR) 10 MG tablet Take 10 mg by mouth daily.     . TOPROL XL 25 MG 24 hr tablet Take 25 mg by mouth daily.     Marland Kitchen warfarin (COUMADIN) 2.5 MG tablet TAKE 1 TABLET DAILY AS DIRECTED 90 tablet 1   No current facility-administered medications for this visit.     LABS/IMAGING: No results found for this or any previous visit (from the past 48 hour(s)). No results  found.  VITALS: BP 130/86   Pulse 78   Ht 5' 8.5" (1.74 m)   Wt 197 lb (89.4 kg)   BMI 29.52 kg/m   EXAM: General appearance: alert and no distress Neck: no carotid bruit and no JVD Lungs: clear to auscultation bilaterally Heart: irregularly irregular rhythm Abdomen: soft, non-tender; bowel sounds normal; no masses,  no organomegaly Extremities: extremities normal, atraumatic, no cyanosis or edema Pulses: 2+ and symmetric Skin: Skin color, texture, turgor normal. No rashes or lesions  EKG: Atrial fibrillation with controlled ventricular response of 78  ASSESSMENT: 1. Permanent atrial  fibrillation 2. Hypertension - controlled 3. Chronic anticoagulation  PLAN: 1.   [name] is not having issues with her atrial fibrillation. At this point it is permanent. Her blood pressure is well controlled. INR has been stable. Ok to follow-up annually or sooner if necessary.  [name], MD, [facility] Attending Cardiologist [place] 12/31/2016, 5:17 PM

## 2016-12-31 NOTE — Patient Instructions (Signed)
Your physician wants you to follow-up in: ONE YEAR with Dr. Hilty. You will receive a reminder letter in the mail two months in advance. If you don't receive a letter, please call our office to schedule the follow-up appointment.  

## 2017-01-01 DIAGNOSIS — H353132 Nonexudative age-related macular degeneration, bilateral, intermediate dry stage: Secondary | ICD-10-CM | POA: Diagnosis not present

## 2017-01-01 DIAGNOSIS — H35372 Puckering of macula, left eye: Secondary | ICD-10-CM | POA: Diagnosis not present

## 2017-02-05 ENCOUNTER — Ambulatory Visit (INDEPENDENT_AMBULATORY_CARE_PROVIDER_SITE_OTHER): Payer: Medicare Other | Admitting: Pharmacist

## 2017-02-05 DIAGNOSIS — I482 Chronic atrial fibrillation: Secondary | ICD-10-CM

## 2017-02-05 DIAGNOSIS — I4891 Unspecified atrial fibrillation: Secondary | ICD-10-CM | POA: Diagnosis not present

## 2017-02-05 DIAGNOSIS — Z7901 Long term (current) use of anticoagulants: Secondary | ICD-10-CM

## 2017-02-05 DIAGNOSIS — I4821 Permanent atrial fibrillation: Secondary | ICD-10-CM

## 2017-02-05 LAB — POCT INR: INR: 2.5

## 2017-03-17 DIAGNOSIS — M859 Disorder of bone density and structure, unspecified: Secondary | ICD-10-CM | POA: Diagnosis not present

## 2017-03-17 DIAGNOSIS — R7301 Impaired fasting glucose: Secondary | ICD-10-CM | POA: Diagnosis not present

## 2017-03-17 DIAGNOSIS — E784 Other hyperlipidemia: Secondary | ICD-10-CM | POA: Diagnosis not present

## 2017-03-17 DIAGNOSIS — I10 Essential (primary) hypertension: Secondary | ICD-10-CM | POA: Diagnosis not present

## 2017-03-19 ENCOUNTER — Ambulatory Visit (INDEPENDENT_AMBULATORY_CARE_PROVIDER_SITE_OTHER): Payer: Medicare Other | Admitting: Pharmacist

## 2017-03-19 DIAGNOSIS — I4821 Permanent atrial fibrillation: Secondary | ICD-10-CM

## 2017-03-19 DIAGNOSIS — I4891 Unspecified atrial fibrillation: Secondary | ICD-10-CM | POA: Diagnosis not present

## 2017-03-19 DIAGNOSIS — I482 Chronic atrial fibrillation: Secondary | ICD-10-CM

## 2017-03-19 DIAGNOSIS — Z7901 Long term (current) use of anticoagulants: Secondary | ICD-10-CM | POA: Diagnosis not present

## 2017-03-19 LAB — POCT INR: INR: 2.4

## 2017-03-24 DIAGNOSIS — H3530 Unspecified macular degeneration: Secondary | ICD-10-CM | POA: Diagnosis not present

## 2017-03-24 DIAGNOSIS — E784 Other hyperlipidemia: Secondary | ICD-10-CM | POA: Diagnosis not present

## 2017-03-24 DIAGNOSIS — M5380 Other specified dorsopathies, site unspecified: Secondary | ICD-10-CM | POA: Diagnosis not present

## 2017-03-24 DIAGNOSIS — M859 Disorder of bone density and structure, unspecified: Secondary | ICD-10-CM | POA: Diagnosis not present

## 2017-03-24 DIAGNOSIS — Z1231 Encounter for screening mammogram for malignant neoplasm of breast: Secondary | ICD-10-CM | POA: Diagnosis not present

## 2017-03-24 DIAGNOSIS — Z1389 Encounter for screening for other disorder: Secondary | ICD-10-CM | POA: Diagnosis not present

## 2017-03-24 DIAGNOSIS — Z0000 Encounter for general adult medical examination without abnormal findings: Secondary | ICD-10-CM | POA: Diagnosis not present

## 2017-03-24 DIAGNOSIS — E559 Vitamin D deficiency, unspecified: Secondary | ICD-10-CM | POA: Diagnosis not present

## 2017-03-24 DIAGNOSIS — I480 Paroxysmal atrial fibrillation: Secondary | ICD-10-CM | POA: Diagnosis not present

## 2017-03-24 DIAGNOSIS — I10 Essential (primary) hypertension: Secondary | ICD-10-CM | POA: Diagnosis not present

## 2017-03-24 DIAGNOSIS — R42 Dizziness and giddiness: Secondary | ICD-10-CM | POA: Diagnosis not present

## 2017-03-24 DIAGNOSIS — Z6829 Body mass index (BMI) 29.0-29.9, adult: Secondary | ICD-10-CM | POA: Diagnosis not present

## 2017-04-30 ENCOUNTER — Ambulatory Visit (INDEPENDENT_AMBULATORY_CARE_PROVIDER_SITE_OTHER): Payer: Medicare Other | Admitting: Pharmacist

## 2017-04-30 DIAGNOSIS — Z7901 Long term (current) use of anticoagulants: Secondary | ICD-10-CM | POA: Diagnosis not present

## 2017-04-30 DIAGNOSIS — I4821 Permanent atrial fibrillation: Secondary | ICD-10-CM

## 2017-04-30 DIAGNOSIS — I4891 Unspecified atrial fibrillation: Secondary | ICD-10-CM | POA: Diagnosis not present

## 2017-04-30 DIAGNOSIS — I482 Chronic atrial fibrillation: Secondary | ICD-10-CM | POA: Diagnosis not present

## 2017-04-30 LAB — POCT INR: INR: 1.7

## 2017-05-07 ENCOUNTER — Other Ambulatory Visit: Payer: Self-pay | Admitting: Internal Medicine

## 2017-05-07 DIAGNOSIS — H43813 Vitreous degeneration, bilateral: Secondary | ICD-10-CM | POA: Diagnosis not present

## 2017-05-07 DIAGNOSIS — Z961 Presence of intraocular lens: Secondary | ICD-10-CM | POA: Diagnosis not present

## 2017-05-07 DIAGNOSIS — H52203 Unspecified astigmatism, bilateral: Secondary | ICD-10-CM | POA: Diagnosis not present

## 2017-05-07 DIAGNOSIS — H26491 Other secondary cataract, right eye: Secondary | ICD-10-CM | POA: Diagnosis not present

## 2017-06-11 ENCOUNTER — Ambulatory Visit (INDEPENDENT_AMBULATORY_CARE_PROVIDER_SITE_OTHER): Payer: Medicare Other | Admitting: Pharmacist Clinician (PhC)/ Clinical Pharmacy Specialist

## 2017-06-11 DIAGNOSIS — Z7901 Long term (current) use of anticoagulants: Secondary | ICD-10-CM | POA: Diagnosis not present

## 2017-06-11 DIAGNOSIS — I4891 Unspecified atrial fibrillation: Secondary | ICD-10-CM | POA: Diagnosis not present

## 2017-06-11 DIAGNOSIS — I4821 Permanent atrial fibrillation: Secondary | ICD-10-CM

## 2017-06-11 DIAGNOSIS — I482 Chronic atrial fibrillation: Secondary | ICD-10-CM

## 2017-06-11 LAB — POCT INR: INR: 2.5

## 2017-06-19 DIAGNOSIS — H26491 Other secondary cataract, right eye: Secondary | ICD-10-CM | POA: Diagnosis not present

## 2017-06-25 DIAGNOSIS — H353131 Nonexudative age-related macular degeneration, bilateral, early dry stage: Secondary | ICD-10-CM | POA: Diagnosis not present

## 2017-06-25 DIAGNOSIS — H43813 Vitreous degeneration, bilateral: Secondary | ICD-10-CM | POA: Diagnosis not present

## 2017-07-23 ENCOUNTER — Ambulatory Visit (INDEPENDENT_AMBULATORY_CARE_PROVIDER_SITE_OTHER): Payer: Medicare Other | Admitting: Pharmacist Clinician (PhC)/ Clinical Pharmacy Specialist

## 2017-07-23 DIAGNOSIS — I4891 Unspecified atrial fibrillation: Secondary | ICD-10-CM | POA: Diagnosis not present

## 2017-07-23 DIAGNOSIS — I482 Chronic atrial fibrillation: Secondary | ICD-10-CM | POA: Diagnosis not present

## 2017-07-23 DIAGNOSIS — I4821 Permanent atrial fibrillation: Secondary | ICD-10-CM

## 2017-07-23 DIAGNOSIS — Z7901 Long term (current) use of anticoagulants: Secondary | ICD-10-CM

## 2017-07-23 LAB — POCT INR: INR: 2.4

## 2017-09-02 ENCOUNTER — Ambulatory Visit (INDEPENDENT_AMBULATORY_CARE_PROVIDER_SITE_OTHER): Payer: Medicare Other | Admitting: Pharmacist Clinician (PhC)/ Clinical Pharmacy Specialist

## 2017-09-02 DIAGNOSIS — I4891 Unspecified atrial fibrillation: Secondary | ICD-10-CM | POA: Diagnosis not present

## 2017-09-02 DIAGNOSIS — Z7901 Long term (current) use of anticoagulants: Secondary | ICD-10-CM | POA: Diagnosis not present

## 2017-09-02 DIAGNOSIS — I4821 Permanent atrial fibrillation: Secondary | ICD-10-CM

## 2017-09-02 LAB — POCT INR: INR: 2.5

## 2017-09-10 DIAGNOSIS — Z23 Encounter for immunization: Secondary | ICD-10-CM | POA: Diagnosis not present

## 2017-10-20 ENCOUNTER — Ambulatory Visit (INDEPENDENT_AMBULATORY_CARE_PROVIDER_SITE_OTHER): Payer: Medicare Other | Admitting: Pharmacist Clinician (PhC)/ Clinical Pharmacy Specialist

## 2017-10-20 DIAGNOSIS — I4821 Permanent atrial fibrillation: Secondary | ICD-10-CM

## 2017-10-20 DIAGNOSIS — I4891 Unspecified atrial fibrillation: Secondary | ICD-10-CM | POA: Diagnosis not present

## 2017-10-20 DIAGNOSIS — Z7901 Long term (current) use of anticoagulants: Secondary | ICD-10-CM | POA: Diagnosis not present

## 2017-10-20 DIAGNOSIS — I482 Chronic atrial fibrillation: Secondary | ICD-10-CM

## 2017-10-20 LAB — POCT INR: INR: 2.8

## 2017-11-10 ENCOUNTER — Other Ambulatory Visit: Payer: Self-pay | Admitting: Internal Medicine

## 2017-11-10 DIAGNOSIS — Z1231 Encounter for screening mammogram for malignant neoplasm of breast: Secondary | ICD-10-CM

## 2017-12-02 ENCOUNTER — Ambulatory Visit (INDEPENDENT_AMBULATORY_CARE_PROVIDER_SITE_OTHER): Payer: Medicare Other | Admitting: Pharmacist Clinician (PhC)/ Clinical Pharmacy Specialist

## 2017-12-02 DIAGNOSIS — Z7901 Long term (current) use of anticoagulants: Secondary | ICD-10-CM

## 2017-12-02 DIAGNOSIS — I482 Chronic atrial fibrillation: Secondary | ICD-10-CM

## 2017-12-02 DIAGNOSIS — I4821 Permanent atrial fibrillation: Secondary | ICD-10-CM

## 2017-12-02 DIAGNOSIS — I4891 Unspecified atrial fibrillation: Secondary | ICD-10-CM

## 2017-12-02 LAB — POCT INR: INR: 3

## 2017-12-17 ENCOUNTER — Ambulatory Visit
Admission: RE | Admit: 2017-12-17 | Discharge: 2017-12-17 | Disposition: A | Discharge status: Home / Self Care | Payer: Medicare Other | Source: Ambulatory Visit | Attending: Internal Medicine | Admitting: Internal Medicine

## 2017-12-17 DIAGNOSIS — Z1231 Encounter for screening mammogram for malignant neoplasm of breast: Secondary | ICD-10-CM | POA: Diagnosis not present

## 2017-12-31 DIAGNOSIS — H35372 Puckering of macula, left eye: Secondary | ICD-10-CM | POA: Diagnosis not present

## 2017-12-31 DIAGNOSIS — H353132 Nonexudative age-related macular degeneration, bilateral, intermediate dry stage: Secondary | ICD-10-CM | POA: Diagnosis not present

## 2017-12-31 DIAGNOSIS — H43813 Vitreous degeneration, bilateral: Secondary | ICD-10-CM | POA: Diagnosis not present

## 2018-01-06 ENCOUNTER — Encounter: Payer: Self-pay | Admitting: Internal Medicine

## 2018-01-06 ENCOUNTER — Ambulatory Visit (INDEPENDENT_AMBULATORY_CARE_PROVIDER_SITE_OTHER): Payer: Medicare Other | Admitting: Internal Medicine

## 2018-01-06 ENCOUNTER — Ambulatory Visit (INDEPENDENT_AMBULATORY_CARE_PROVIDER_SITE_OTHER): Payer: Medicare Other | Admitting: Pharmacist Clinician (PhC)/ Clinical Pharmacy Specialist

## 2018-01-06 VITALS — BP 136/88 | HR 69 | Ht 68.5 in | Wt 197.4 lb

## 2018-01-06 DIAGNOSIS — I482 Chronic atrial fibrillation: Secondary | ICD-10-CM

## 2018-01-06 DIAGNOSIS — Z7901 Long term (current) use of anticoagulants: Secondary | ICD-10-CM

## 2018-01-06 DIAGNOSIS — I4821 Permanent atrial fibrillation: Secondary | ICD-10-CM

## 2018-01-06 DIAGNOSIS — I4891 Unspecified atrial fibrillation: Secondary | ICD-10-CM

## 2018-01-06 DIAGNOSIS — I10 Essential (primary) hypertension: Secondary | ICD-10-CM | POA: Diagnosis not present

## 2018-01-06 LAB — POCT INR: INR: 2.6

## 2018-01-06 NOTE — Patient Instructions (Signed)
Your physician wants you to follow-up in: ONE YEAR with Dr. Hilty. You will receive a reminder letter in the mail two months in advance. If you don't receive a letter, please call our office to schedule the follow-up appointment.  

## 2018-01-06 NOTE — Progress Notes (Signed)
OFFICE NOTE  Chief Complaint:  Doing well  Primary Care Physician: Crist Infante, MD  HPI:  Sheena Benson is an 84 year old female now whom I have been following for atrial fibrillation which is persistent, is not chronic. She had a TEE cardioversion which restored sinus but quickly went back into AFib, was tried on Multaq and was intolerant of that, and actually was not really certain that she was aware of her AFib. Then we decided to pursue rate control and she has done very well with that. She has been therapeutic in her INRs on Coumadin without any bleeding problems. She is without complaints. Her INR was checked today and it was 1.9.  Sheena Benson returns today for follow-up. She is having no complaints. Her INR has been well controlled and followed by Erasmo Downer, our anticoagulation pharmacist. Recently she had a upper respiratory infection and required a Z-Pak as well as prednisone and Tessalon Perles. She still has some cough but is improving.  Sheena Benson returns today for follow-up. Overall she's doing well. She denies any chest pain or worsening shortness of breath. INR has been stable. She remains in A. fib which is rate controlled. Blood pressures is at goal.  12/31/2016  Sheena Benson returns today for follow-up. Her last year she is without any complaints. She denies any chest pain or worsening shortness of breath. Her INR has been consistently at goal every 6 weeks. Today was 2.4 is due for a recheck his next month. Blood pressure is well controlled. Cholesterol is followed by her primary care provider. She remains in permanent atrial fibrillation.  01/06/2018  Sheena Benson was seen today in follow-up.  Again she is doing well.  She denies any chest pain or shortness of breath.  Her A. fib has been rate controlled.  Her INR is due for check today.  Generally has been stable.  She recently had some bleeding which she cut herself on a can lid however otherwise is done well.  PMHx:  Past Medical  History:  Diagnosis Date  . Atrial fibrillation ([place])    intolerant to Multaq  . CAD (coronary artery disease)    mild  . History of nuclear stress test 08/2010   dipyridamole; mild ischemia in mid anterior & apical anterior regions; post-stress EF 72%  . Hyperlipidemia   . Hypertension   . Uterine cancer ([place]) 1964    Past Surgical History:  Procedure Laterality Date  . ABDOMINAL HYSTERECTOMY  1964  . FRACTURE SURGERY     foot  . TEE WITH CARDIOVERSION  08/07/2010   EF 55-60%; mild MR; SR back into AF (Dr. K. Mali Hilty)     FAMHx:  Family History  Problem Relation Age of Onset  . Heart disease Mother     SOCHx:   reports that  has never smoked. she has never used smokeless tobacco. She reports that she does not drink alcohol or use drugs.  ALLERGIES:  No Known Allergies  ROS: Pertinent items noted in HPI and remainder of comprehensive ROS otherwise negative.  HOME MEDS: Current Outpatient Medications  Medication Sig Dispense Refill  . lisinopril (PRINIVIL,ZESTRIL) 20 MG tablet Take 20 mg by mouth daily.  11  . meclizine (ANTIVERT) 25 MG tablet Take 1 tablet by mouth 3 (three) times daily as needed.  1  . Multiple Vitamins-Minerals (MULTIVITAMIN WITH MINERALS) tablet Take 1 tablet by mouth daily.    . simvastatin (ZOCOR) 10 MG tablet Take 10 mg by mouth daily.     Marland Kitchen  TOPROL XL 25 MG 24 hr tablet Take 25 mg by mouth daily.     Marland Kitchen warfarin (COUMADIN) 2.5 MG tablet Take 1/2 to 1 tablet daily as directed by coumadin clinic 90 tablet 1   No current facility-administered medications for this visit.     LABS/IMAGING: No results found for this or any previous visit (from the past 48 hour(s)). No results found.  VITALS: BP 136/88   Pulse 69   Ht 5' 8.5" (1.74 m)   Wt 197 lb 6.4 oz (89.5 kg)   BMI 29.58 kg/m   EXAM: General appearance: alert and no distress Neck: no carotid bruit and no JVD Lungs: clear to auscultation bilaterally Heart: irregularly irregular  rhythm Abdomen: soft, non-tender; bowel sounds normal; no masses,  no organomegaly Extremities: extremities normal, atraumatic, no cyanosis or edema Pulses: 2+ and symmetric Skin: Skin color, texture, turgor normal. No rashes or lesions  EKG: A. fib at 69, nonspecific T wave changes-personally reviewed  ASSESSMENT: 1. Permanent atrial fibrillation 2. Hypertension - controlled 3. Chronic anticoagulation  PLAN: 1.   Sheena Benson continues to do well without any new symptoms.  She is due for repeat INR today.  Her A. fib is rate controlled.  Blood pressure is at goal.  No new complaints.  She will be welcoming her first great grandchild in June.  Plan to see her back annually or sooner as necessary.  [name], MD, [facility], [place] Director of the Advanced Lipid Disorders &  Cardiovascular Risk Reduction Clinic Diplomate of the American Board of Clinical Lipidology Attending Cardiologist  Direct Dial: [phone]  Fax: [phone]  Website:  [email or web address] 01/06/2018, 1:26 PM

## 2018-01-13 ENCOUNTER — Other Ambulatory Visit: Payer: Self-pay | Admitting: Internal Medicine

## 2018-02-17 ENCOUNTER — Ambulatory Visit (INDEPENDENT_AMBULATORY_CARE_PROVIDER_SITE_OTHER): Payer: Medicare Other | Admitting: Pharmacist Clinician (PhC)/ Clinical Pharmacy Specialist

## 2018-02-17 DIAGNOSIS — Z7901 Long term (current) use of anticoagulants: Secondary | ICD-10-CM

## 2018-02-17 DIAGNOSIS — I4821 Permanent atrial fibrillation: Secondary | ICD-10-CM

## 2018-02-17 DIAGNOSIS — I4891 Unspecified atrial fibrillation: Secondary | ICD-10-CM

## 2018-02-17 LAB — POCT INR: INR: 2.2

## 2018-04-01 ENCOUNTER — Ambulatory Visit (INDEPENDENT_AMBULATORY_CARE_PROVIDER_SITE_OTHER): Payer: Medicare Other | Admitting: Pharmacist

## 2018-04-01 DIAGNOSIS — I4821 Permanent atrial fibrillation: Secondary | ICD-10-CM

## 2018-04-01 DIAGNOSIS — I482 Chronic atrial fibrillation: Secondary | ICD-10-CM

## 2018-04-01 DIAGNOSIS — Z7901 Long term (current) use of anticoagulants: Secondary | ICD-10-CM | POA: Diagnosis not present

## 2018-04-01 DIAGNOSIS — I4891 Unspecified atrial fibrillation: Secondary | ICD-10-CM

## 2018-04-01 LAB — POCT INR: INR: 2.1

## 2018-04-23 DIAGNOSIS — R7301 Impaired fasting glucose: Secondary | ICD-10-CM | POA: Diagnosis not present

## 2018-04-23 DIAGNOSIS — R82998 Other abnormal findings in urine: Secondary | ICD-10-CM | POA: Diagnosis not present

## 2018-04-23 DIAGNOSIS — E559 Vitamin D deficiency, unspecified: Secondary | ICD-10-CM | POA: Diagnosis not present

## 2018-04-23 DIAGNOSIS — E7849 Other hyperlipidemia: Secondary | ICD-10-CM | POA: Diagnosis not present

## 2018-04-23 DIAGNOSIS — I10 Essential (primary) hypertension: Secondary | ICD-10-CM | POA: Diagnosis not present

## 2018-04-30 DIAGNOSIS — M859 Disorder of bone density and structure, unspecified: Secondary | ICD-10-CM | POA: Diagnosis not present

## 2018-04-30 DIAGNOSIS — Z6831 Body mass index (BMI) 31.0-31.9, adult: Secondary | ICD-10-CM | POA: Diagnosis not present

## 2018-04-30 DIAGNOSIS — G608 Other hereditary and idiopathic neuropathies: Secondary | ICD-10-CM | POA: Diagnosis not present

## 2018-04-30 DIAGNOSIS — R42 Dizziness and giddiness: Secondary | ICD-10-CM | POA: Diagnosis not present

## 2018-04-30 DIAGNOSIS — Z0000 Encounter for general adult medical examination without abnormal findings: Secondary | ICD-10-CM | POA: Diagnosis not present

## 2018-04-30 DIAGNOSIS — G609 Hereditary and idiopathic neuropathy, unspecified: Secondary | ICD-10-CM | POA: Diagnosis not present

## 2018-04-30 DIAGNOSIS — I480 Paroxysmal atrial fibrillation: Secondary | ICD-10-CM | POA: Diagnosis not present

## 2018-04-30 DIAGNOSIS — M5380 Other specified dorsopathies, site unspecified: Secondary | ICD-10-CM | POA: Diagnosis not present

## 2018-04-30 DIAGNOSIS — E7849 Other hyperlipidemia: Secondary | ICD-10-CM | POA: Diagnosis not present

## 2018-04-30 DIAGNOSIS — J302 Other seasonal allergic rhinitis: Secondary | ICD-10-CM | POA: Diagnosis not present

## 2018-04-30 DIAGNOSIS — Z1389 Encounter for screening for other disorder: Secondary | ICD-10-CM | POA: Diagnosis not present

## 2018-04-30 DIAGNOSIS — I10 Essential (primary) hypertension: Secondary | ICD-10-CM | POA: Diagnosis not present

## 2018-04-30 DIAGNOSIS — R7301 Impaired fasting glucose: Secondary | ICD-10-CM | POA: Diagnosis not present

## 2018-05-12 ENCOUNTER — Ambulatory Visit (INDEPENDENT_AMBULATORY_CARE_PROVIDER_SITE_OTHER): Payer: Medicare Other | Admitting: Pharmacist Clinician (PhC)/ Clinical Pharmacy Specialist

## 2018-05-12 DIAGNOSIS — Z7901 Long term (current) use of anticoagulants: Secondary | ICD-10-CM

## 2018-05-12 DIAGNOSIS — I4891 Unspecified atrial fibrillation: Secondary | ICD-10-CM

## 2018-05-12 LAB — POCT INR: INR: 3 (ref 2.0–3.0)

## 2018-05-20 DIAGNOSIS — H0100A Unspecified blepharitis right eye, upper and lower eyelids: Secondary | ICD-10-CM | POA: Diagnosis not present

## 2018-05-20 DIAGNOSIS — H353132 Nonexudative age-related macular degeneration, bilateral, intermediate dry stage: Secondary | ICD-10-CM | POA: Diagnosis not present

## 2018-05-20 DIAGNOSIS — H52203 Unspecified astigmatism, bilateral: Secondary | ICD-10-CM | POA: Diagnosis not present

## 2018-05-20 DIAGNOSIS — H0100B Unspecified blepharitis left eye, upper and lower eyelids: Secondary | ICD-10-CM | POA: Diagnosis not present

## 2018-06-24 ENCOUNTER — Ambulatory Visit (INDEPENDENT_AMBULATORY_CARE_PROVIDER_SITE_OTHER): Payer: Medicare Other | Admitting: Pharmacist Clinician (PhC)/ Clinical Pharmacy Specialist

## 2018-06-24 DIAGNOSIS — I4891 Unspecified atrial fibrillation: Secondary | ICD-10-CM | POA: Diagnosis not present

## 2018-06-24 DIAGNOSIS — I4821 Permanent atrial fibrillation: Secondary | ICD-10-CM

## 2018-06-24 DIAGNOSIS — Z7901 Long term (current) use of anticoagulants: Secondary | ICD-10-CM | POA: Diagnosis not present

## 2018-06-24 LAB — POCT INR: INR: 2.4 (ref 2.0–3.0)

## 2018-06-24 NOTE — Patient Instructions (Signed)
Description   Continue with 1 tablet daily except 1/2 tablet each Monday and Friday, repeat INR in 6 weeks.  Call [phone] if you are about out of warfarin and need to move your appointment up.  Will transition to Eliquis at next visit

## 2018-07-01 DIAGNOSIS — H43813 Vitreous degeneration, bilateral: Secondary | ICD-10-CM | POA: Diagnosis not present

## 2018-07-01 DIAGNOSIS — H353131 Nonexudative age-related macular degeneration, bilateral, early dry stage: Secondary | ICD-10-CM | POA: Diagnosis not present

## 2018-08-05 ENCOUNTER — Ambulatory Visit (INDEPENDENT_AMBULATORY_CARE_PROVIDER_SITE_OTHER): Payer: Medicare Other | Admitting: Pharmacist Clinician (PhC)/ Clinical Pharmacy Specialist

## 2018-08-05 DIAGNOSIS — I4891 Unspecified atrial fibrillation: Secondary | ICD-10-CM | POA: Diagnosis not present

## 2018-08-05 DIAGNOSIS — I4821 Permanent atrial fibrillation: Secondary | ICD-10-CM

## 2018-08-05 DIAGNOSIS — Z7901 Long term (current) use of anticoagulants: Secondary | ICD-10-CM | POA: Diagnosis not present

## 2018-08-05 LAB — POCT INR: INR: 2.4 (ref 2.0–3.0)

## 2018-08-05 NOTE — Patient Instructions (Signed)
Description   Continue with 1 tablet daily except 1/2 tablet each Monday and Friday, repeat INR in 4 weeks.  Call [phone] if you are about out of warfarin and need to move your appointment up.  Will transition to Eliquis at next visit

## 2018-09-02 ENCOUNTER — Ambulatory Visit (INDEPENDENT_AMBULATORY_CARE_PROVIDER_SITE_OTHER): Payer: Medicare Other | Admitting: Pharmacist Clinician (PhC)/ Clinical Pharmacy Specialist

## 2018-09-02 DIAGNOSIS — Z7901 Long term (current) use of anticoagulants: Secondary | ICD-10-CM

## 2018-09-02 DIAGNOSIS — I4821 Permanent atrial fibrillation: Secondary | ICD-10-CM

## 2018-09-02 DIAGNOSIS — I4891 Unspecified atrial fibrillation: Secondary | ICD-10-CM | POA: Diagnosis not present

## 2018-09-02 LAB — POCT INR: INR: 2.3 (ref 2.0–3.0)

## 2018-09-02 MED ORDER — APIXABAN 5 MG PO TABS: 5.0000 mg | ORAL_TABLET | Freq: Two times a day (BID) | ORAL | 1 refills | Status: DC

## 2018-09-02 NOTE — Patient Instructions (Signed)
Description   Continue warfarin 1/2 tablet each Monday and Friday, 1 tablet all other days.  When Eliquis arrives please take first dose 24 hours after last dose of warfarin

## 2018-09-03 ENCOUNTER — Telehealth: Payer: Self-pay | Admitting: Internal Medicine

## 2018-09-03 NOTE — Telephone Encounter (Signed)
Faxed PA request completed/signed to [phone] for eliquis.   Diagnosis: atrial fibrillation, has NOT been started on eliquis for treatment of an acute thromboembolic condition, YES has tried warfarin

## 2018-09-04 NOTE — Telephone Encounter (Signed)
Received fax noticed from [place] that patient has been approved for Eliquis from 08/04/18 - 09/03/19.

## 2018-09-08 DIAGNOSIS — Z23 Encounter for immunization: Secondary | ICD-10-CM | POA: Diagnosis not present

## 2018-09-15 DIAGNOSIS — L820 Inflamed seborrheic keratosis: Secondary | ICD-10-CM | POA: Diagnosis not present

## 2018-09-15 DIAGNOSIS — L570 Actinic keratosis: Secondary | ICD-10-CM | POA: Diagnosis not present

## 2018-09-15 DIAGNOSIS — D225 Melanocytic nevi of trunk: Secondary | ICD-10-CM | POA: Diagnosis not present

## 2018-09-15 DIAGNOSIS — L821 Other seborrheic keratosis: Secondary | ICD-10-CM | POA: Diagnosis not present

## 2018-11-09 ENCOUNTER — Other Ambulatory Visit: Payer: Self-pay | Admitting: Internal Medicine

## 2018-11-09 DIAGNOSIS — Z1231 Encounter for screening mammogram for malignant neoplasm of breast: Secondary | ICD-10-CM

## 2018-12-18 ENCOUNTER — Ambulatory Visit
Admission: RE | Admit: 2018-12-18 | Discharge: 2018-12-18 | Disposition: A | Discharge status: Home / Self Care | Payer: Medicare Other | Source: Ambulatory Visit | Attending: Internal Medicine | Admitting: Internal Medicine

## 2018-12-18 DIAGNOSIS — Z1231 Encounter for screening mammogram for malignant neoplasm of breast: Secondary | ICD-10-CM

## 2018-12-22 ENCOUNTER — Other Ambulatory Visit: Payer: Self-pay | Admitting: Internal Medicine

## 2018-12-22 DIAGNOSIS — R928 Other abnormal and inconclusive findings on diagnostic imaging of breast: Secondary | ICD-10-CM

## 2018-12-25 ENCOUNTER — Ambulatory Visit: Payer: Medicare Other

## 2018-12-25 ENCOUNTER — Ambulatory Visit
Admission: RE | Admit: 2018-12-25 | Discharge: 2018-12-25 | Disposition: A | Discharge status: Home / Self Care | Payer: Medicare Other | Source: Ambulatory Visit | Attending: Internal Medicine | Admitting: Internal Medicine

## 2018-12-25 DIAGNOSIS — R928 Other abnormal and inconclusive findings on diagnostic imaging of breast: Secondary | ICD-10-CM | POA: Diagnosis not present

## 2019-01-19 ENCOUNTER — Encounter: Payer: Self-pay | Admitting: Internal Medicine

## 2019-01-19 ENCOUNTER — Ambulatory Visit (INDEPENDENT_AMBULATORY_CARE_PROVIDER_SITE_OTHER): Payer: Medicare Other | Admitting: Internal Medicine

## 2019-01-19 DIAGNOSIS — I10 Essential (primary) hypertension: Secondary | ICD-10-CM | POA: Diagnosis not present

## 2019-01-19 DIAGNOSIS — I4821 Permanent atrial fibrillation: Secondary | ICD-10-CM | POA: Diagnosis not present

## 2019-01-19 DIAGNOSIS — Z7901 Long term (current) use of anticoagulants: Secondary | ICD-10-CM

## 2019-01-19 NOTE — Progress Notes (Signed)
OFFICE NOTE  Chief Complaint:  No complaints  Primary Care Physician: Crist Infante, MD  HPI:  Sheena Benson is an 85 year old female now whom I have been following for atrial fibrillation which is persistent, is not chronic. She had a TEE cardioversion which restored sinus but quickly went back into AFib, was tried on Multaq and was intolerant of that, and actually was not really certain that she was aware of her AFib. Then we decided to pursue rate control and she has done very well with that. She has been therapeutic in her INRs on Coumadin without any bleeding problems. She is without complaints. Her INR was checked today and it was 1.9.  Sheena Benson returns today for follow-up. She is having no complaints. Her INR has been well controlled and followed by Erasmo Downer, our anticoagulation pharmacist. Recently she had a upper respiratory infection and required a Z-Pak as well as prednisone and Tessalon Perles. She still has some cough but is improving.  Sheena Benson returns today for follow-up. Overall she's doing well. She denies any chest pain or worsening shortness of breath. INR has been stable. She remains in A. fib which is rate controlled. Blood pressures is at goal.  12/31/2016  Sheena Benson returns today for follow-up. Her last year she is without any complaints. She denies any chest pain or worsening shortness of breath. Her INR has been consistently at goal every 6 weeks. Today was 2.4 is due for a recheck his next month. Blood pressure is well controlled. Cholesterol is followed by her primary care provider. She remains in permanent atrial fibrillation.  01/06/2018  Sheena Benson was seen today in follow-up.  Again she is doing well.  She denies any chest pain or shortness of breath.  Her A. fib has been rate controlled.  Her INR is due for check today.  Generally has been stable.  She recently had some bleeding which she cut herself on a can lid however otherwise is done well.  01/23/2019  Sheena Benson is seen  today in annual follow-up.  She continues to do well.  She does note that she has to nap during the day but in general has pretty good energy level.  She remains in permanent A. fib at 86.  Blood pressure is well controlled.  Her PCP follows her dyslipidemia.  PMHx:  Past Medical History:  Diagnosis Date  . Atrial fibrillation ([place])    intolerant to Multaq  . CAD (coronary artery disease)    mild  . History of nuclear stress test 08/2010   dipyridamole; mild ischemia in mid anterior & apical anterior regions; post-stress EF 72%  . Hyperlipidemia   . Hypertension   . Uterine cancer ([place]) 1964    Past Surgical History:  Procedure Laterality Date  . ABDOMINAL HYSTERECTOMY  1964  . FRACTURE SURGERY     foot  . TEE WITH CARDIOVERSION  08/07/2010   EF 55-60%; mild MR; SR back into AF (Dr. K. Mali Derril Franek)     FAMHx:  Family History  Problem Relation Age of Onset  . Heart disease Mother     SOCHx:   reports that she has never smoked. She has never used smokeless tobacco. She reports that she does not drink alcohol or use drugs.  ALLERGIES:  No Known Allergies  ROS: Pertinent items noted in HPI and remainder of comprehensive ROS otherwise negative.  HOME MEDS: Current Outpatient Medications  Medication Sig Dispense Refill  . apixaban (ELIQUIS) 5 MG TABS tablet Take 1  tablet (5 mg total) by mouth 2 (two) times daily. 180 tablet 1  . lisinopril (PRINIVIL,ZESTRIL) 20 MG tablet Take 20 mg by mouth daily.  11  . meclizine (ANTIVERT) 25 MG tablet Take 1 tablet by mouth 3 (three) times daily as needed.  1  . Multiple Vitamins-Minerals (MULTIVITAMIN WITH MINERALS) tablet Take 1 tablet by mouth daily.    . simvastatin (ZOCOR) 10 MG tablet Take 10 mg by mouth daily.     . TOPROL XL 25 MG 24 hr tablet Take 25 mg by mouth daily.      No current facility-administered medications for this visit.     LABS/IMAGING: No results found for this or any previous visit (from the past 48  hour(s)). No results found.  VITALS: BP 136/68   Pulse 86   Ht 5\' 8"  (1.727 m)   Wt 201 lb 12.8 oz (91.5 kg)   BMI 30.68 kg/m   EXAM: General appearance: alert and no distress Neck: no carotid bruit and no JVD Lungs: clear to auscultation bilaterally Heart: irregularly irregular rhythm Abdomen: soft, non-tender; bowel sounds normal; no masses,  no organomegaly Extremities: extremities normal, atraumatic, no cyanosis or edema Pulses: 2+ and symmetric Skin: Skin color, texture, turgor normal. No rashes or lesions  EKG: A. fib at 86-personally reviewed  ASSESSMENT: 1. Permanent atrial fibrillation 2. Hypertension - controlled 3. Chronic anticoagulation  PLAN: 1.   Mrs. Keilman is totally asymptomatic with her A. fib.  Blood pressures well controlled.  No bleeding issues on Eliquis.  Labs have been stable from her PCP.  She is on low-dose simvastatin - lipids are followed by her PCP.  Plan follow-up with me annually or sooner as necessary.  [name], MD, [facility], [place] Director of the Advanced Lipid Disorders &  Cardiovascular Risk Reduction Clinic Diplomate of the American Board of Clinical Lipidology Attending Cardiologist  Direct Dial: [phone]  Fax: [phone]  Website:  [email or web address] 01/19/2019, 1:58 PM

## 2019-01-19 NOTE — Patient Instructions (Signed)
Medication Instructions:  Continue same medications If you need a refill on your cardiac medications before your next appointment, please call your pharmacy.   Lab work: None ordered   Testing/Procedures: None ordered  Follow-Up: At [facility], you and your health needs are our priority.  As part of our continuing mission to provide you with exceptional heart care, we have created designated Provider Care Teams.  These Care Teams include your primary Cardiologist (physician) and Advanced Practice Providers (APPs -  Physician Assistants and Nurse Practitioners) who all work together to provide you with the care you need, when you need it. . Follow up with Dr.Hilty in 1 year  Call 3 months before to schedule

## 2019-01-20 DIAGNOSIS — H35033 Hypertensive retinopathy, bilateral: Secondary | ICD-10-CM | POA: Diagnosis not present

## 2019-01-20 DIAGNOSIS — H35373 Puckering of macula, bilateral: Secondary | ICD-10-CM | POA: Diagnosis not present

## 2019-01-20 DIAGNOSIS — H35722 Serous detachment of retinal pigment epithelium, left eye: Secondary | ICD-10-CM | POA: Diagnosis not present

## 2019-01-20 DIAGNOSIS — H35453 Secondary pigmentary degeneration, bilateral: Secondary | ICD-10-CM | POA: Diagnosis not present

## 2019-01-20 DIAGNOSIS — H43392 Other vitreous opacities, left eye: Secondary | ICD-10-CM | POA: Diagnosis not present

## 2019-01-20 DIAGNOSIS — H353122 Nonexudative age-related macular degeneration, left eye, intermediate dry stage: Secondary | ICD-10-CM | POA: Diagnosis not present

## 2019-01-20 DIAGNOSIS — H353111 Nonexudative age-related macular degeneration, right eye, early dry stage: Secondary | ICD-10-CM | POA: Diagnosis not present

## 2019-01-20 DIAGNOSIS — H35363 Drusen (degenerative) of macula, bilateral: Secondary | ICD-10-CM | POA: Diagnosis not present

## 2019-01-23 ENCOUNTER — Encounter: Payer: Self-pay | Admitting: Internal Medicine

## 2019-02-08 ENCOUNTER — Other Ambulatory Visit: Payer: Self-pay | Admitting: Internal Medicine

## 2019-02-09 NOTE — Telephone Encounter (Signed)
Scr = 0.9 ([place] @ [facility]) on 03/2018

## 2019-06-16 DIAGNOSIS — E7849 Other hyperlipidemia: Secondary | ICD-10-CM | POA: Diagnosis not present

## 2019-06-16 DIAGNOSIS — M859 Disorder of bone density and structure, unspecified: Secondary | ICD-10-CM | POA: Diagnosis not present

## 2019-06-16 DIAGNOSIS — R7301 Impaired fasting glucose: Secondary | ICD-10-CM | POA: Diagnosis not present

## 2019-06-16 DIAGNOSIS — R82998 Other abnormal findings in urine: Secondary | ICD-10-CM | POA: Diagnosis not present

## 2019-06-16 DIAGNOSIS — I10 Essential (primary) hypertension: Secondary | ICD-10-CM | POA: Diagnosis not present

## 2019-06-23 DIAGNOSIS — M5380 Other specified dorsopathies, site unspecified: Secondary | ICD-10-CM | POA: Diagnosis not present

## 2019-06-23 DIAGNOSIS — J302 Other seasonal allergic rhinitis: Secondary | ICD-10-CM | POA: Diagnosis not present

## 2019-06-23 DIAGNOSIS — Z0000 Encounter for general adult medical examination without abnormal findings: Secondary | ICD-10-CM | POA: Diagnosis not present

## 2019-06-23 DIAGNOSIS — E7849 Other hyperlipidemia: Secondary | ICD-10-CM | POA: Diagnosis not present

## 2019-06-23 DIAGNOSIS — Z1331 Encounter for screening for depression: Secondary | ICD-10-CM | POA: Diagnosis not present

## 2019-06-23 DIAGNOSIS — G609 Hereditary and idiopathic neuropathy, unspecified: Secondary | ICD-10-CM | POA: Diagnosis not present

## 2019-06-23 DIAGNOSIS — M8580 Other specified disorders of bone density and structure, unspecified site: Secondary | ICD-10-CM | POA: Diagnosis not present

## 2019-06-23 DIAGNOSIS — L989 Disorder of the skin and subcutaneous tissue, unspecified: Secondary | ICD-10-CM | POA: Diagnosis not present

## 2019-06-23 DIAGNOSIS — I10 Essential (primary) hypertension: Secondary | ICD-10-CM | POA: Diagnosis not present

## 2019-06-23 DIAGNOSIS — M859 Disorder of bone density and structure, unspecified: Secondary | ICD-10-CM | POA: Diagnosis not present

## 2019-06-23 DIAGNOSIS — K449 Diaphragmatic hernia without obstruction or gangrene: Secondary | ICD-10-CM | POA: Diagnosis not present

## 2019-06-23 DIAGNOSIS — I480 Paroxysmal atrial fibrillation: Secondary | ICD-10-CM | POA: Diagnosis not present

## 2019-06-23 DIAGNOSIS — R7301 Impaired fasting glucose: Secondary | ICD-10-CM | POA: Diagnosis not present

## 2019-07-20 ENCOUNTER — Other Ambulatory Visit: Payer: Self-pay | Admitting: Internal Medicine

## 2019-07-20 NOTE — Telephone Encounter (Signed)
Please review for refill. Thank you! 

## 2019-07-20 NOTE — Telephone Encounter (Signed)
Scr 0.8 06/16/2019 20f 91.5kg Lovw/hilty 01/19/19

## 2019-08-20 ENCOUNTER — Telehealth: Payer: Self-pay | Admitting: Internal Medicine

## 2019-08-20 NOTE — Telephone Encounter (Signed)
PA for eliquis approved [id number];Review Type:Prior Auth;Coverage Start Date:07/21/2019;Coverage End Date:08/19/2020;

## 2019-09-20 DIAGNOSIS — Z23 Encounter for immunization: Secondary | ICD-10-CM | POA: Diagnosis not present

## 2019-11-08 DIAGNOSIS — H524 Presbyopia: Secondary | ICD-10-CM | POA: Diagnosis not present

## 2019-11-08 DIAGNOSIS — H43813 Vitreous degeneration, bilateral: Secondary | ICD-10-CM | POA: Diagnosis not present

## 2019-11-08 DIAGNOSIS — H353132 Nonexudative age-related macular degeneration, bilateral, intermediate dry stage: Secondary | ICD-10-CM | POA: Diagnosis not present

## 2019-11-08 DIAGNOSIS — H35372 Puckering of macula, left eye: Secondary | ICD-10-CM | POA: Diagnosis not present

## 2019-11-29 ENCOUNTER — Other Ambulatory Visit: Payer: Self-pay | Admitting: Internal Medicine

## 2019-11-29 DIAGNOSIS — Z9289 Personal history of other medical treatment: Secondary | ICD-10-CM

## 2019-11-29 DIAGNOSIS — Z1231 Encounter for screening mammogram for malignant neoplasm of breast: Secondary | ICD-10-CM

## 2020-01-05 ENCOUNTER — Ambulatory Visit
Admission: RE | Admit: 2020-01-05 | Discharge: 2020-01-05 | Disposition: A | Discharge status: Home / Self Care | Payer: Medicare Other | Source: Ambulatory Visit | Attending: Internal Medicine | Admitting: Internal Medicine

## 2020-01-05 ENCOUNTER — Other Ambulatory Visit: Payer: Self-pay

## 2020-01-05 DIAGNOSIS — Z1231 Encounter for screening mammogram for malignant neoplasm of breast: Secondary | ICD-10-CM

## 2020-01-13 ENCOUNTER — Other Ambulatory Visit: Payer: Self-pay | Admitting: Internal Medicine

## 2020-01-13 NOTE — Telephone Encounter (Signed)
89F 91.5 kg, SCr 0.8 (7/20 [facility]) LOV 2/20 Hilty  (Scheduled for 3/21)

## 2020-01-26 ENCOUNTER — Other Ambulatory Visit: Payer: Self-pay

## 2020-01-26 ENCOUNTER — Encounter: Payer: Self-pay | Admitting: Internal Medicine

## 2020-01-26 ENCOUNTER — Ambulatory Visit (INDEPENDENT_AMBULATORY_CARE_PROVIDER_SITE_OTHER): Payer: Medicare Other | Admitting: Internal Medicine

## 2020-01-26 VITALS — BP 140/92 | HR 80 | Ht 68.5 in | Wt 192.0 lb

## 2020-01-26 DIAGNOSIS — E782 Mixed hyperlipidemia: Secondary | ICD-10-CM | POA: Diagnosis not present

## 2020-01-26 DIAGNOSIS — I10 Essential (primary) hypertension: Secondary | ICD-10-CM

## 2020-01-26 DIAGNOSIS — I4821 Permanent atrial fibrillation: Secondary | ICD-10-CM | POA: Diagnosis not present

## 2020-01-26 DIAGNOSIS — Z7901 Long term (current) use of anticoagulants: Secondary | ICD-10-CM | POA: Diagnosis not present

## 2020-01-26 NOTE — Patient Instructions (Signed)

## 2020-01-26 NOTE — Progress Notes (Signed)
OFFICE NOTE  Chief Complaint:  No complaints  Primary Care Physician: Crist Infante, MD  HPI:  Sheena Benson is an 86 year old female now whom I have been following for atrial fibrillation which is persistent, is not chronic. She had a TEE cardioversion which restored sinus but quickly went back into AFib, was tried on Multaq and was intolerant of that, and actually was not really certain that she was aware of her AFib. Then we decided to pursue rate control and she has done very well with that. She has been therapeutic in her INRs on Coumadin without any bleeding problems. She is without complaints. Her INR was checked today and it was 1.9.  Sheena Benson returns today for follow-up. She is having no complaints. Her INR has been well controlled and followed by Erasmo Downer, our anticoagulation pharmacist. Recently she had a upper respiratory infection and required a Z-Pak as well as prednisone and Tessalon Perles. She still has some cough but is improving.  Sheena Benson returns today for follow-up. Overall she's doing well. She denies any chest pain or worsening shortness of breath. INR has been stable. She remains in A. fib which is rate controlled. Blood pressures is at goal.  12/31/2016  Sheena Benson returns today for follow-up. Her last year she is without any complaints. She denies any chest pain or worsening shortness of breath. Her INR has been consistently at goal every 6 weeks. Today was 2.4 is due for a recheck his next month. Blood pressure is well controlled. Cholesterol is followed by her primary care provider. She remains in permanent atrial fibrillation.  01/06/2018  Sheena Benson was seen today in follow-up.  Again she is doing well.  She denies any chest pain or shortness of breath.  Her A. fib has been rate controlled.  Her INR is due for check today.  Generally has been stable.  She recently had some bleeding which she cut herself on a can lid however otherwise is done well.  01/23/2019  Sheena Benson is seen  today in annual follow-up.  She continues to do well.  She does note that she has to nap during the day but in general has pretty good energy level.  She remains in permanent A. fib at 86.  Blood pressure is well controlled.  Her PCP follows her dyslipidemia.  01/26/2020  Sheena Benson returns today for follow-up.  No new issues related to A. fib.  She is tolerating Eliquis which she likes better than warfarin.  Cholesterol is well controlled.  Her blood pressure has been at target.  She is not yet received her COVID-19 vaccine however she is interested and will be looking into it.  PMHx:  Past Medical History:  Diagnosis Date  . Atrial fibrillation ([place])    intolerant to Multaq  . CAD (coronary artery disease)    mild  . History of nuclear stress test 08/2010   dipyridamole; mild ischemia in mid anterior & apical anterior regions; post-stress EF 72%  . Hyperlipidemia   . Hypertension   . Uterine cancer ([place]) 1964    Past Surgical History:  Procedure Laterality Date  . ABDOMINAL HYSTERECTOMY  1964  . FRACTURE SURGERY     foot  . TEE WITH CARDIOVERSION  08/07/2010   EF 55-60%; mild MR; SR back into AF (Dr. K. Mali Atilano Covelli)     FAMHx:  Family History  Problem Relation Age of Onset  . Heart disease Mother     SOCHx:   reports that she has never  smoked. She has never used smokeless tobacco. She reports that she does not drink alcohol or use drugs.  ALLERGIES:  No Known Allergies  ROS: Pertinent items noted in HPI and remainder of comprehensive ROS otherwise negative.  HOME MEDS: Current Outpatient Medications  Medication Sig Dispense Refill  . ELIQUIS 5 MG TABS tablet TAKE 1 TABLET TWICE A DAY 180 tablet 1  . lisinopril (PRINIVIL,ZESTRIL) 20 MG tablet Take 20 mg by mouth daily.  11  . meclizine (ANTIVERT) 25 MG tablet Take 1 tablet by mouth 3 (three) times daily as needed.  1  . Multiple Vitamins-Minerals (MULTIVITAMIN WITH MINERALS) tablet Take 1 tablet by mouth daily.    .  simvastatin (ZOCOR) 10 MG tablet Take 10 mg by mouth daily.     . TOPROL XL 25 MG 24 hr tablet Take 25 mg by mouth daily.     . vitamin B-12 (CYANOCOBALAMIN) 250 MCG tablet Take 250 mcg by mouth daily.     No current facility-administered medications for this visit.    LABS/IMAGING: No results found for this or any previous visit (from the past 48 hour(s)). No results found.  VITALS: BP (!) 140/92   Pulse 80   Ht 5' 8.5" (1.74 m)   Wt 192 lb (87.1 kg)   SpO2 98%   BMI 28.77 kg/m   EXAM: General appearance: alert and no distress Neck: no carotid bruit and no JVD Lungs: clear to auscultation bilaterally Heart: irregularly irregular rhythm Abdomen: soft, non-tender; bowel sounds normal; no masses,  no organomegaly Extremities: extremities normal, atraumatic, no cyanosis or edema Pulses: 2+ and symmetric Skin: Skin color, texture, turgor normal. No rashes or lesions  EKG: A. fib at 80-personally reviewed  ASSESSMENT: 1. Permanent atrial fibrillation 2. Hypertension - controlled 3. Chronic anticoagulation 4. Hyperlipidemia  PLAN: 1.   Sheena Benson continues to do well and is asymptomatic and rate controlled A. fib.  She has no bleeding issues with Eliquis.  Her blood pressure is well controlled.  Cholesterol is followed by her PCP.  This is been at goal.  Plan follow-up with me annually or sooner as necessary.  [name], MD, [facility], [place] Director of the Advanced Lipid Disorders &  Cardiovascular Risk Reduction Clinic Diplomate of the American Board of Clinical Lipidology Attending Cardiologist  Direct Dial: [phone]  Fax: [phone]  Website:  [email or web address] 01/26/2020, 4:30 PM

## 2020-06-20 ENCOUNTER — Other Ambulatory Visit: Payer: Self-pay | Admitting: Internal Medicine

## 2020-07-05 ENCOUNTER — Ambulatory Visit (HOSPITAL_COMMUNITY)
Admission: EM | Admit: 2020-07-05 | Discharge: 2020-07-05 | Disposition: A | Discharge status: Home / Self Care | Payer: Medicare Other

## 2020-07-05 ENCOUNTER — Other Ambulatory Visit: Payer: Self-pay

## 2020-07-05 ENCOUNTER — Emergency Department (HOSPITAL_COMMUNITY): Payer: Medicare Other

## 2020-07-05 ENCOUNTER — Emergency Department (HOSPITAL_COMMUNITY)
Admission: EM | Admit: 2020-07-05 | Discharge: 2020-07-05 | Disposition: A | Discharge status: Home / Self Care | Payer: Medicare Other | Attending: Emergency Medicine | Admitting: Emergency Medicine

## 2020-07-05 ENCOUNTER — Ambulatory Visit
Admission: EM | Admit: 2020-07-05 | Discharge: 2020-07-05 | Disposition: A | Discharge status: Home / Self Care | Payer: Medicare Other

## 2020-07-05 DIAGNOSIS — I10 Essential (primary) hypertension: Secondary | ICD-10-CM | POA: Diagnosis not present

## 2020-07-05 DIAGNOSIS — Z79899 Other long term (current) drug therapy: Secondary | ICD-10-CM | POA: Insufficient documentation

## 2020-07-05 DIAGNOSIS — Y939 Activity, unspecified: Secondary | ICD-10-CM | POA: Insufficient documentation

## 2020-07-05 DIAGNOSIS — W19XXXA Unspecified fall, initial encounter: Secondary | ICD-10-CM | POA: Diagnosis not present

## 2020-07-05 DIAGNOSIS — S0990XA Unspecified injury of head, initial encounter: Secondary | ICD-10-CM | POA: Diagnosis not present

## 2020-07-05 DIAGNOSIS — Y999 Unspecified external cause status: Secondary | ICD-10-CM | POA: Diagnosis not present

## 2020-07-05 DIAGNOSIS — I2510 Atherosclerotic heart disease of native coronary artery without angina pectoris: Secondary | ICD-10-CM | POA: Insufficient documentation

## 2020-07-05 DIAGNOSIS — Z7901 Long term (current) use of anticoagulants: Secondary | ICD-10-CM | POA: Insufficient documentation

## 2020-07-05 DIAGNOSIS — S0083XA Contusion of other part of head, initial encounter: Secondary | ICD-10-CM | POA: Diagnosis not present

## 2020-07-05 DIAGNOSIS — Z8542 Personal history of malignant neoplasm of other parts of uterus: Secondary | ICD-10-CM | POA: Diagnosis not present

## 2020-07-05 DIAGNOSIS — Y929 Unspecified place or not applicable: Secondary | ICD-10-CM | POA: Diagnosis not present

## 2020-07-05 DIAGNOSIS — M25531 Pain in right wrist: Secondary | ICD-10-CM | POA: Diagnosis not present

## 2020-07-05 NOTE — ED Triage Notes (Signed)
Pt presents to [facility] for assessment after falling this morning and hitting her head.  Bruising ntoed to right side of scalp.  Patient is on Eliquis.    Patient is being discharged from the Urgent Care and sent to the Emergency Department via private vehicle . Patient is in need of higher level of care due to fall, on eliquis, over 86 years old. Patient is aware and verbalizes understanding of plan of care.

## 2020-07-05 NOTE — Consult Note (Signed)
Responded to page, spoke with pt about her feelings post-fall and injury while waiting for her grand-daughter [name] to come meet her bedside. Pt is Sheena Benson, had already prayed with her pastor, and welcomed prayer. I checked with Security, who'd just admitted [name] to seek pt in TraA.  Pt is aware she can ask a nurse to call for a chaplain at any time she is in hospital, and complimented the level of care she has received.  Rev. Eloise Levels Chaplain

## 2020-07-05 NOTE — ED Triage Notes (Signed)
Pt states she fell while going to bathroom at approx 0230 and struck the right side of head and right arm. Right side of forehead above right eye edematous with large area of ecchymosis, right arm with noticeable swelling and large ecchymosis. Pt denies LOC, but "unsure how I fell and I was up worried about it all night. I've never fallen like that."  Pt on eliquis currently. Per Cathlean Sauer, PA pt instructed to go to ER STAT for higher level care and CT 2/2 head injury and right arm injury. Pt verbalized understanding. Patient is being discharged from the Urgent Care and sent to the Emergency Department via POV. Per Cathlean Sauer, patient is in need of higher level of care due to head trauma, anti-coagulants, fall, arm injury. Patient is aware and verbalizes understanding of plan of care.  Vitals:   07/05/20 1542  BP: (!) 151/98  Pulse: 98  Resp: 18  Temp: 98.4 F (36.9 C)  SpO2: 95%

## 2020-07-05 NOTE — ED Notes (Signed)
Discharge instructions discussed with pt. Pt verbalized understanding with no questions at this time. Pt to go home with granddaughter at bedside. Ambulatory at discharge.

## 2020-07-05 NOTE — ED Triage Notes (Signed)
Pt fell at 02:30a.m., went to Urgent Care and sent here for a higher level of care. States she hit her head and right arm (bruise to right temple). Denies LOC, taking Eliquis.

## 2020-07-05 NOTE — ED Provider Notes (Signed)
[facility] EMERGENCY DEPARTMENT Provider Note   CSN: [id number] Arrival date & time: 07/05/20  1925     History Chief Complaint  Patient presents with  . Trauma    Level 2 Fall    Sheena Benson is a 86 y.o. female.  The history is provided by the patient.  Trauma Mechanism of injury: fall Injury location: head/neck (right wrist (fractured right wrist and saw ortho today and placed in a splint)) Injury location detail: head Time since incident: 20 hours Arrived directly from scene: no   EMS/PTA data:      Loss of consciousness: no  Current symptoms:      Associated symptoms:            Denies abdominal pain, back pain, blindness, chest pain, difficulty breathing, headache, hearing loss, loss of consciousness, nausea, neck pain, seizures and vomiting.   Relevant PMH:      Pharmacological risk factors:            Anticoagulation therapy.       Past Medical History:  Diagnosis Date  . Atrial fibrillation ([place])    intolerant to Multaq  . CAD (coronary artery disease)    mild  . History of nuclear stress test 08/2010   dipyridamole; mild ischemia in mid anterior & apical anterior regions; post-stress EF 72%  . Hyperlipidemia   . Hypertension   . Uterine cancer [facility]) 1964    Patient Active Problem List   Diagnosis Date Noted  . Benign essential HTN 11/01/2014  . Intraductal papilloma of right breast 09/21/2013  . Nipple discharge 01/13/2012    Past Surgical History:  Procedure Laterality Date  . ABDOMINAL HYSTERECTOMY  1964  . FRACTURE SURGERY     foot  . TEE WITH CARDIOVERSION  08/07/2010   EF 55-60%; mild MR; SR back into AF (Dr. K. Mali Hilty)      OB History   No obstetric history on file.     Family History  Problem Relation Age of Onset  . Heart disease Mother     Social History   Tobacco Use  . Smoking status: Never Smoker  . Smokeless tobacco: Never Used  Substance Use Topics  . Alcohol use: No  . Drug use: No     Home Medications Prior to Admission medications   Medication Sig Start Date End Date Taking? Authorizing Provider  ELIQUIS 5 MG TABS tablet TAKE 1 TABLET TWICE A DAY 06/20/20  Yes Hilty, Nadean Corwin, MD  lisinopril (PRINIVIL,ZESTRIL) 20 MG tablet Take 20 mg by mouth at bedtime.  01/23/15  Yes [provider]  loratadine (CLARITIN) 10 MG tablet Take 10 mg by mouth daily as needed (for seasonal allergies).   Yes [provider]  metoprolol succinate (TOPROL-XL) 25 MG 24 hr tablet Take 25 mg by mouth at bedtime.   Yes [provider]  Multiple Vitamins-Minerals (MULTIVITAMIN WITH MINERALS) tablet Take 1 tablet by mouth daily.   Yes [provider]  vitamin B-12 (CYANOCOBALAMIN) 250 MCG tablet Take 250 mcg by mouth daily.   Yes [provider]  meclizine (ANTIVERT) 25 MG tablet Take 25 mg by mouth 3 (three) times daily as needed (for vertigo).  10/26/15   [provider]  simvastatin (ZOCOR) 10 MG tablet Take 10 mg by mouth daily.  11/20/11   [provider]  TOPROL XL 25 MG 24 hr tablet Take 25 mg by mouth daily.  01/03/12   [provider]  Allergies    Patient has no known allergies.  Review of Systems   Review of Systems  Constitutional: Negative for chills and fever.  HENT: Negative for ear pain, hearing loss and sore throat.   Eyes: Negative for blindness, pain and visual disturbance.  Respiratory: Negative for cough and shortness of breath.   Cardiovascular: Negative for chest pain and palpitations.  Gastrointestinal: Negative for abdominal pain, nausea and vomiting.  Genitourinary: Negative for dysuria and hematuria.  Musculoskeletal: Positive for arthralgias. Negative for back pain and neck pain.  Skin: Positive for color change. Negative for rash.  Neurological: Negative for dizziness, tremors, seizures, loss of consciousness, syncope, facial asymmetry, speech difficulty, light-headedness, numbness and  headaches.  All other systems reviewed and are negative.   Physical Exam Updated Vital Signs  ED Triage Vitals  Enc Vitals Group     BP 07/05/20 1932 125/83     Pulse Rate 07/05/20 1932 88     Resp 07/05/20 1932 20     Temp 07/05/20 1932 98.1 F (36.7 C)     Temp Source 07/05/20 1932 Oral     SpO2 07/05/20 1932 97 %     Weight 07/05/20 1932 194 lb (88 kg)     Height 07/05/20 1932 5' 9.5" (1.765 m)     Head Circumference --      Peak Flow --      Pain Score 07/05/20 1943 8     Pain Loc --      Pain Edu? --      Excl. in [place]? --     Physical Exam Vitals and nursing note reviewed.  Constitutional:      General: She is not in acute distress.    Appearance: She is well-developed.  HENT:     Head: Normocephalic and atraumatic.     Nose: Nose normal.     Mouth/Throat:     Mouth: Mucous membranes are moist.  Eyes:     Extraocular Movements: Extraocular movements intact.     Conjunctiva/sclera: Conjunctivae normal.     Pupils: Pupils are equal, round, and reactive to light.  Neck:     Comments: , No midline spinal tenderness Cardiovascular:     Rate and Rhythm: Normal rate and regular rhythm.     Heart sounds: No murmur heard.   Pulmonary:     Effort: Pulmonary effort is normal. No respiratory distress.     Breath sounds: Normal breath sounds.  Abdominal:     Palpations: Abdomen is soft.     Tenderness: There is no abdominal tenderness.  Musculoskeletal:        General: Tenderness present. Normal range of motion.     Cervical back: Normal range of motion and neck supple. No tenderness.     Comments: Tenderness to right wrist  Skin:    General: Skin is warm and dry.     Findings: Bruising present.     Comments: Bruising to the right side of the forehead  Neurological:     General: No focal deficit present.     Mental Status: She is alert and oriented to person, place, and time.     Cranial Nerves: No cranial nerve deficit.     Sensory: No sensory deficit.      Motor: No weakness.     Coordination: Coordination normal.     Comments: 5+ out of 5 strength throughout, normal sensation, no drift     ED Results / Procedures / Treatments   Labs (  all labs ordered are listed, but only abnormal results are displayed) Labs Reviewed - No data to display  EKG None  Radiology CT Head Wo Contrast  Result Date: 07/05/2020 CLINICAL DATA:  86 year old female with head trauma. EXAM: CT HEAD WITHOUT CONTRAST TECHNIQUE: Contiguous axial images were obtained from the base of the skull through the vertex without intravenous contrast. COMPARISON:  Head CT dated 03/09/2014. FINDINGS: Brain: Mild age-related atrophy and chronic microvascular ischemic changes. There is no acute intracranial hemorrhage. No mass effect or midline shift no extra-axial fluid collection. Vascular: No hyperdense vessel or unexpected calcification. Skull: Normal. Negative for fracture or focal lesion. Sinuses/Orbits: No acute finding. Other: None IMPRESSION: 1. No acute intracranial pathology. 2. Mild age-related atrophy and chronic microvascular ischemic changes. Electronically Signed   By: Anner Crete M.D.   On: 07/05/2020 21:15    Procedures Procedures (including critical care time)  Medications Ordered in ED Medications - No data to display  ED Course  I have reviewed the triage vital signs and the nursing notes.  Pertinent labs & imaging results that were available during my care of the patient were reviewed by me and considered in my medical decision making (see chart for details).    MDM Rules/Calculators/A&P                          Sheena Benson is an 86 year old female with history of A. fib on Eliquis, hypertension, high cholesterol presents the ED after a fall earlier today.  Patient fell at around 2:30 in the morning has been fine except for some right wrist pain and right forehead bruising.  Went to her orthopedic doctor and diagnosed her with a wrist fracture and  placed her in a splint.  However was told to come to the ED for evaluation given bruising to the right forehead and fall while on blood thinners.  Neurologically she is intact.  Does not have any neck pain.  No midline spinal tenderness.  We will get a head CT to rule out headbleed.  CT scan without any head injury.  Discharged in good condition.  This chart was dictated using voice recognition software.  Despite best efforts to proofread,  errors can occur which can change the documentation meaning.    Final Clinical Impression(s) / ED Diagnoses Final diagnoses:  Fall, initial encounter  Traumatic hematoma of forehead, initial encounter    Rx / DC Orders ED Discharge Orders    None       Lennice Sites, DO 07/05/20 2149

## 2020-07-05 NOTE — Progress Notes (Signed)
Orthopedic Tech Progress Note Patient Details:  Sheena Benson 1929/10/31 [id number] Level 2 Trauma  Patient ID: [name], female   DOB: October 08, 1929, 86 y.o.   MRN: [id number]   [name] 07/05/2020, 8:03 PM

## 2020-07-17 DIAGNOSIS — S52501D Unspecified fracture of the lower end of right radius, subsequent encounter for closed fracture with routine healing: Secondary | ICD-10-CM | POA: Diagnosis not present

## 2020-07-18 DIAGNOSIS — E785 Hyperlipidemia, unspecified: Secondary | ICD-10-CM | POA: Diagnosis not present

## 2020-07-18 DIAGNOSIS — R7301 Impaired fasting glucose: Secondary | ICD-10-CM | POA: Diagnosis not present

## 2020-07-18 DIAGNOSIS — E559 Vitamin D deficiency, unspecified: Secondary | ICD-10-CM | POA: Diagnosis not present

## 2020-07-25 DIAGNOSIS — G609 Hereditary and idiopathic neuropathy, unspecified: Secondary | ICD-10-CM | POA: Diagnosis not present

## 2020-07-25 DIAGNOSIS — E785 Hyperlipidemia, unspecified: Secondary | ICD-10-CM | POA: Diagnosis not present

## 2020-07-25 DIAGNOSIS — Z23 Encounter for immunization: Secondary | ICD-10-CM | POA: Diagnosis not present

## 2020-07-25 DIAGNOSIS — H3530 Unspecified macular degeneration: Secondary | ICD-10-CM | POA: Diagnosis not present

## 2020-07-25 DIAGNOSIS — R82998 Other abnormal findings in urine: Secondary | ICD-10-CM | POA: Diagnosis not present

## 2020-07-25 DIAGNOSIS — I480 Paroxysmal atrial fibrillation: Secondary | ICD-10-CM | POA: Diagnosis not present

## 2020-07-25 DIAGNOSIS — L989 Disorder of the skin and subcutaneous tissue, unspecified: Secondary | ICD-10-CM | POA: Diagnosis not present

## 2020-07-25 DIAGNOSIS — R7301 Impaired fasting glucose: Secondary | ICD-10-CM | POA: Diagnosis not present

## 2020-07-25 DIAGNOSIS — D6869 Other thrombophilia: Secondary | ICD-10-CM | POA: Diagnosis not present

## 2020-07-25 DIAGNOSIS — M8580 Other specified disorders of bone density and structure, unspecified site: Secondary | ICD-10-CM | POA: Diagnosis not present

## 2020-07-25 DIAGNOSIS — Z0000 Encounter for general adult medical examination without abnormal findings: Secondary | ICD-10-CM | POA: Diagnosis not present

## 2020-07-25 DIAGNOSIS — Z1331 Encounter for screening for depression: Secondary | ICD-10-CM | POA: Diagnosis not present

## 2020-07-25 DIAGNOSIS — I10 Essential (primary) hypertension: Secondary | ICD-10-CM | POA: Diagnosis not present

## 2020-07-26 ENCOUNTER — Other Ambulatory Visit: Payer: Self-pay | Admitting: Internal Medicine

## 2020-07-26 DIAGNOSIS — M8580 Other specified disorders of bone density and structure, unspecified site: Secondary | ICD-10-CM

## 2020-08-01 DIAGNOSIS — S52501D Unspecified fracture of the lower end of right radius, subsequent encounter for closed fracture with routine healing: Secondary | ICD-10-CM | POA: Diagnosis not present

## 2020-08-07 ENCOUNTER — Other Ambulatory Visit: Payer: Medicare Other

## 2020-08-15 DIAGNOSIS — S52501D Unspecified fracture of the lower end of right radius, subsequent encounter for closed fracture with routine healing: Secondary | ICD-10-CM | POA: Diagnosis not present

## 2020-08-22 DIAGNOSIS — Z23 Encounter for immunization: Secondary | ICD-10-CM | POA: Diagnosis not present

## 2020-08-28 ENCOUNTER — Other Ambulatory Visit: Payer: Self-pay

## 2020-08-28 ENCOUNTER — Other Ambulatory Visit: Payer: Medicare Other

## 2020-08-28 ENCOUNTER — Ambulatory Visit
Admission: RE | Admit: 2020-08-28 | Discharge: 2020-08-28 | Disposition: A | Discharge status: Home / Self Care | Payer: Medicare Other | Source: Ambulatory Visit | Attending: Internal Medicine | Admitting: Internal Medicine

## 2020-08-28 DIAGNOSIS — Z780 Asymptomatic menopausal state: Secondary | ICD-10-CM | POA: Diagnosis not present

## 2020-08-28 DIAGNOSIS — M8580 Other specified disorders of bone density and structure, unspecified site: Secondary | ICD-10-CM

## 2020-08-30 DIAGNOSIS — S52501D Unspecified fracture of the lower end of right radius, subsequent encounter for closed fracture with routine healing: Secondary | ICD-10-CM | POA: Diagnosis not present

## 2020-09-20 ENCOUNTER — Telehealth: Payer: Self-pay | Admitting: Internal Medicine

## 2020-09-20 NOTE — Telephone Encounter (Signed)
eliquis PA submitted via fax to [employer] @ [phone]

## 2020-09-25 NOTE — Telephone Encounter (Addendum)
eliquis approved 08/22/2020 - 09/21/2021

## 2020-10-22 IMAGING — MG DIGITAL SCREENING BILATERAL MAMMOGRAM WITH CAD
4 series · 4 of 4 positions shown · non-contrast
Comparison: Previous exam(s).

CLINICAL DATA: Screening.

EXAM:
DIGITAL SCREENING BILATERAL MAMMOGRAM WITH CAD

[L CC]
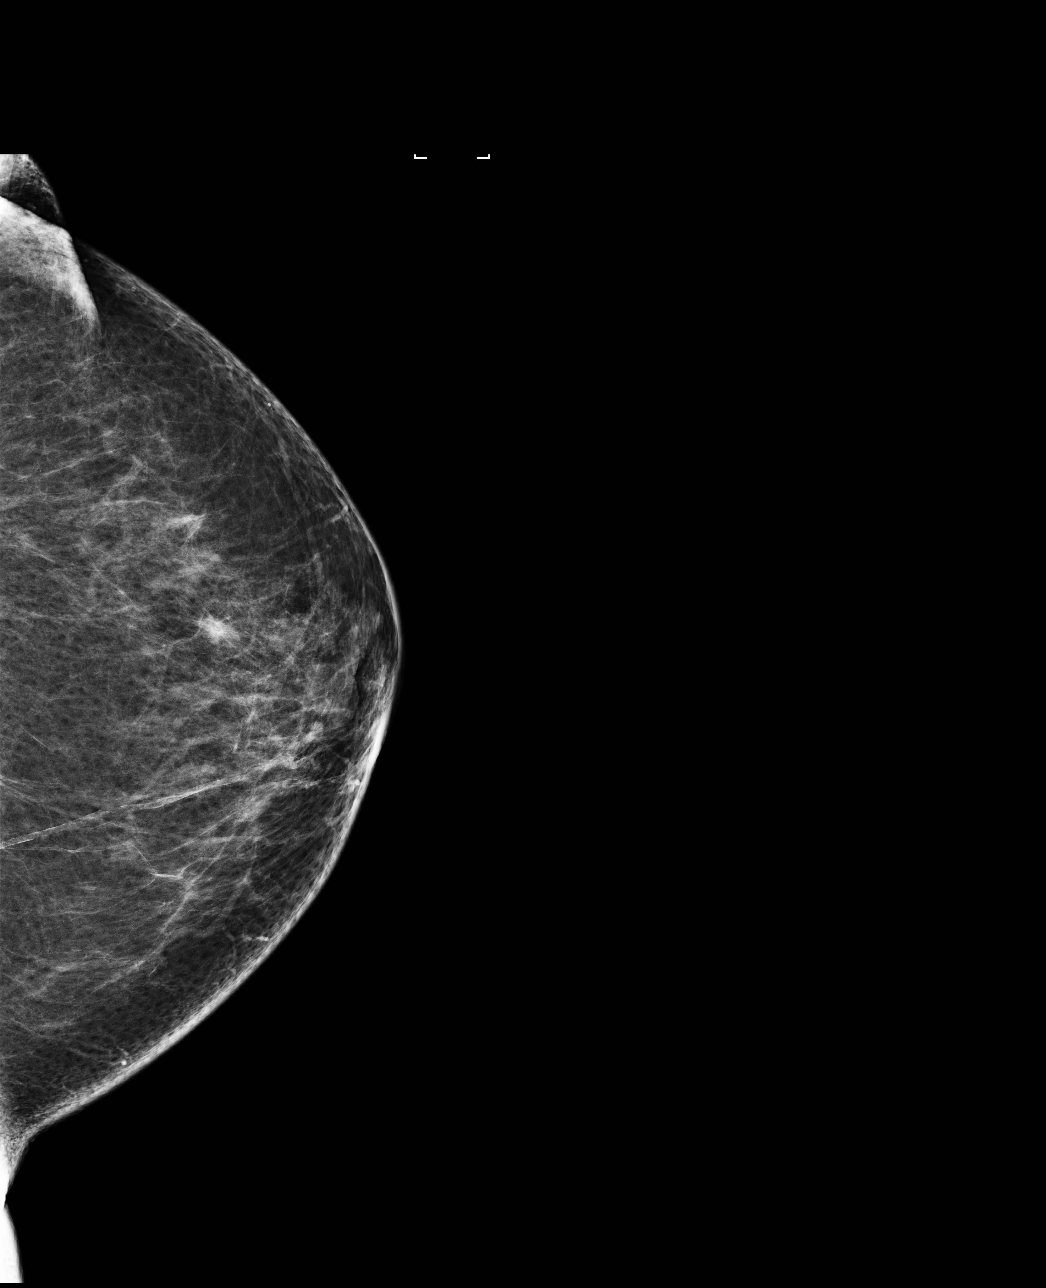

[L MLO]
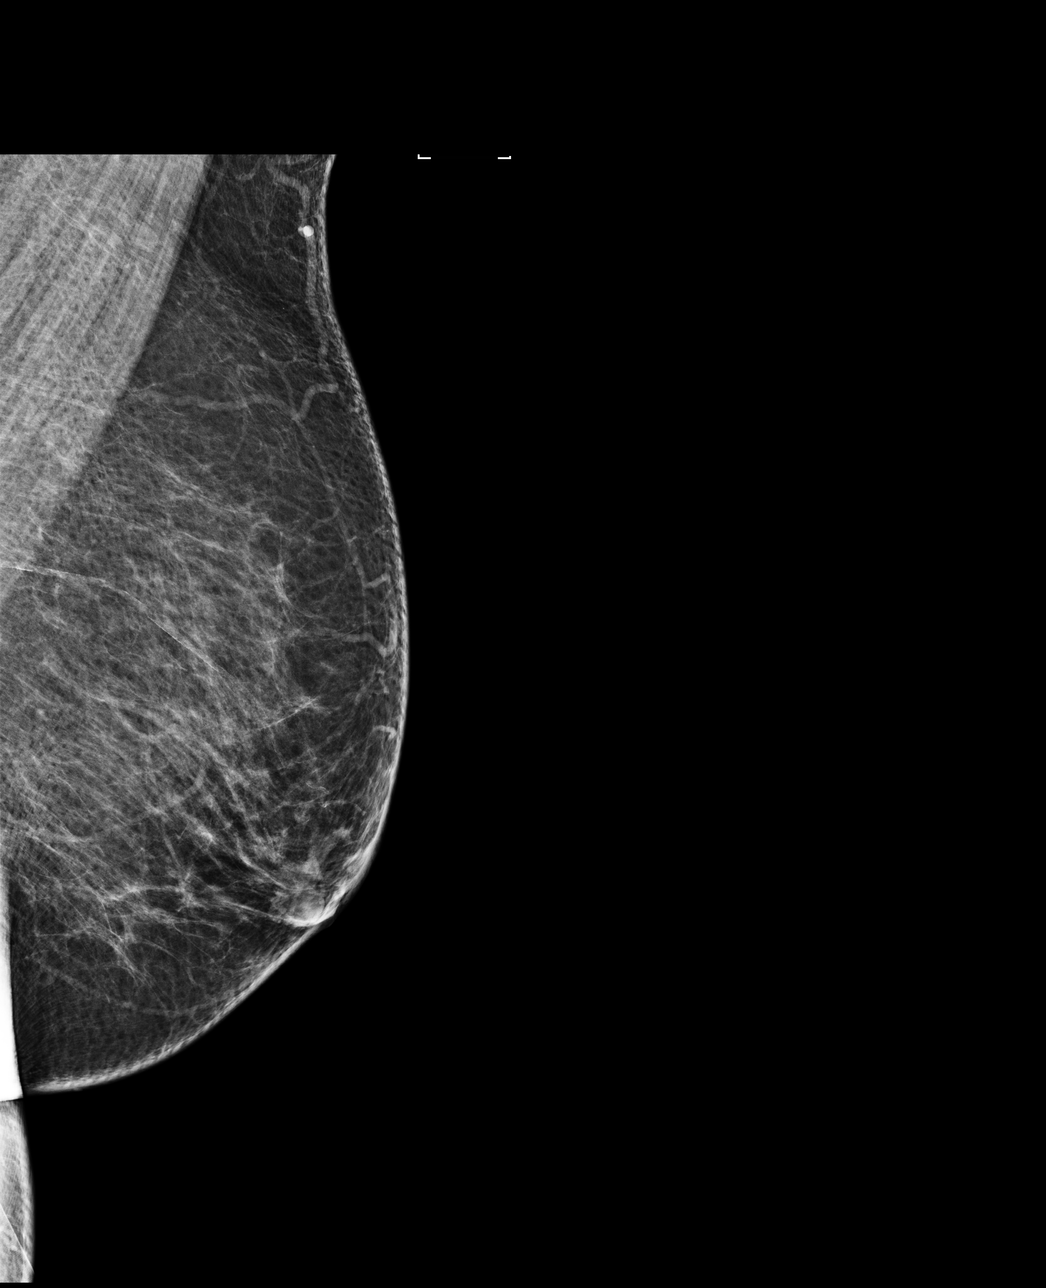

[R MLO]
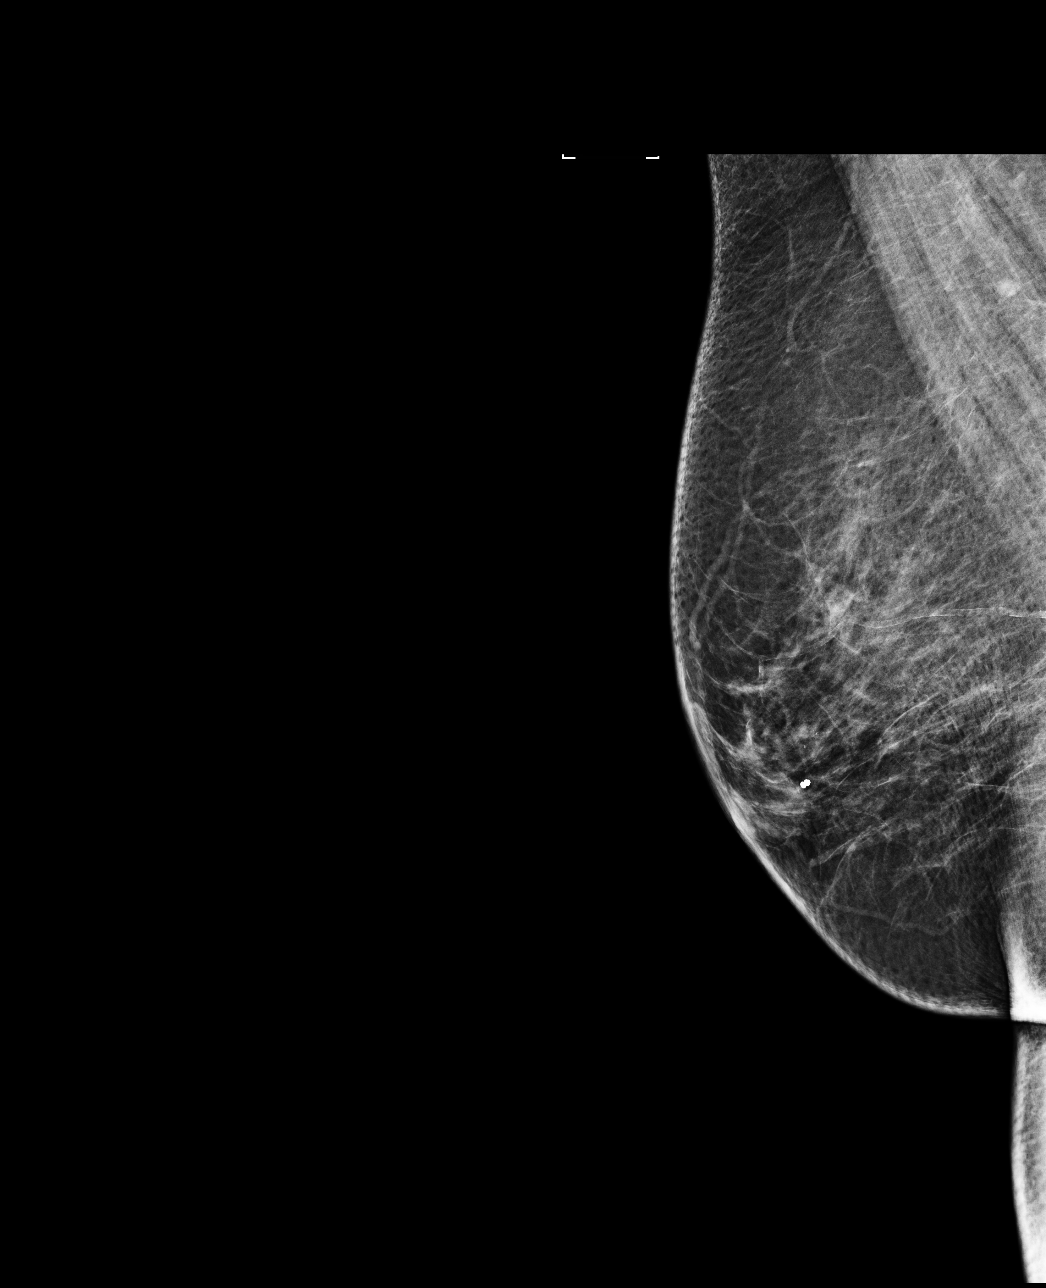

[R CC]
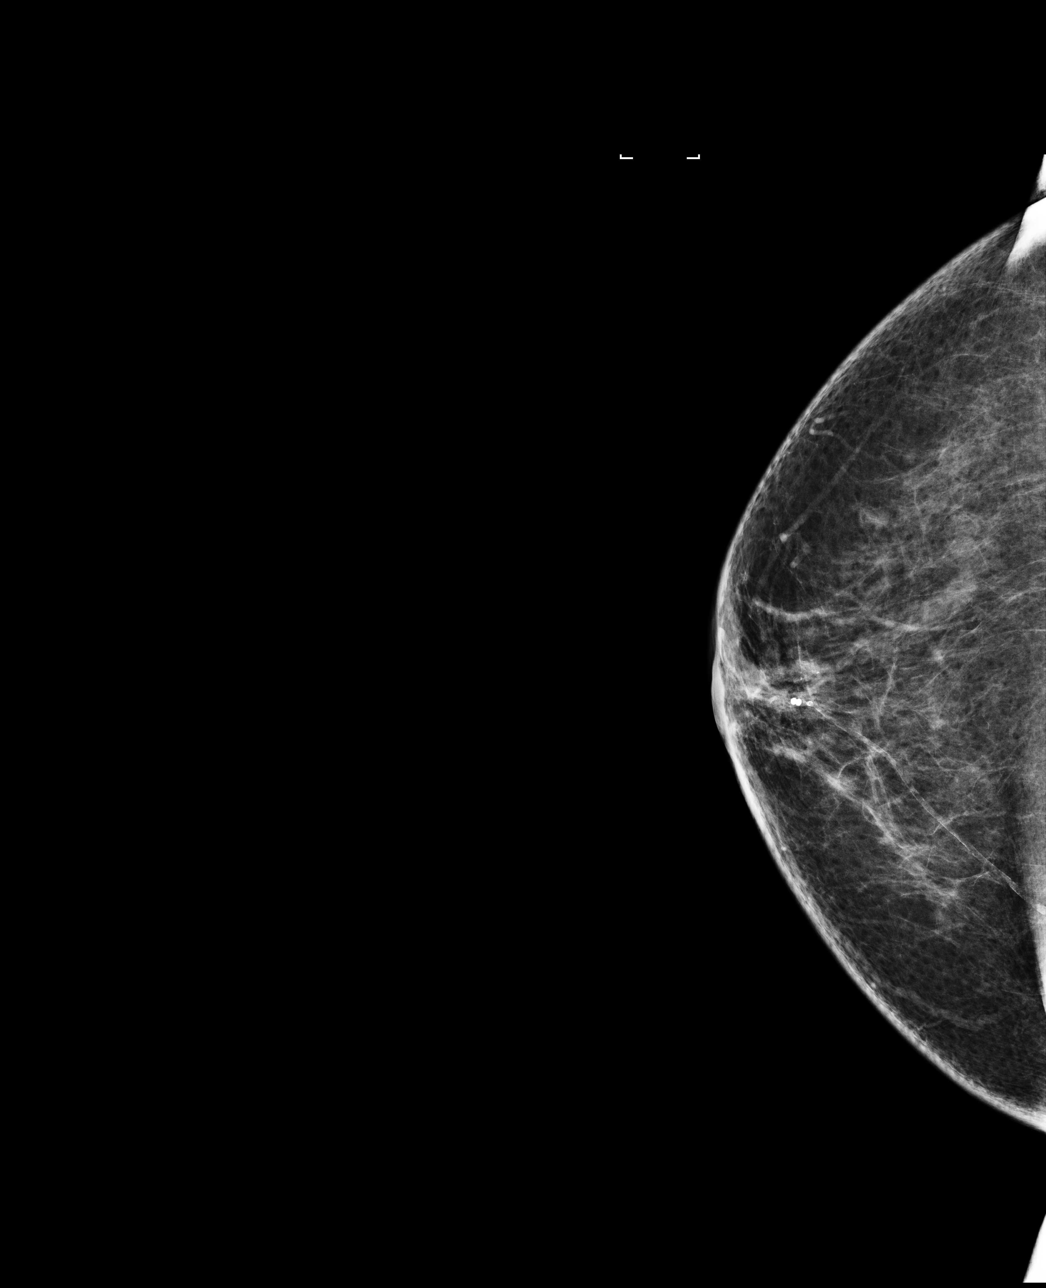

[4 of 4 positions shown; findings below may reference images not displayed]

ACR Breast Density Category b: There are scattered areas of
fibroglandular density.
FINDINGS: In the left breast, a possible asymmetry warrants further
evaluation. In the right breast, no findings suspicious for
malignancy. Images were processed with CAD.
IMPRESSION: Further evaluation is suggested for possible asymmetry in the left
breast.

RECOMMENDATION:
Diagnostic mammogram and possibly ultrasound of the left breast.
(Code:6F-Z-CCD)

The patient will be contacted regarding the findings, and additional
imaging will be scheduled.

BI-RADS CATEGORY  0: Incomplete. Need additional imaging evaluation
and/or prior mammograms for comparison.

## 2020-11-08 DIAGNOSIS — H353132 Nonexudative age-related macular degeneration, bilateral, intermediate dry stage: Secondary | ICD-10-CM | POA: Diagnosis not present

## 2020-11-08 DIAGNOSIS — H524 Presbyopia: Secondary | ICD-10-CM | POA: Diagnosis not present

## 2020-11-08 DIAGNOSIS — H35372 Puckering of macula, left eye: Secondary | ICD-10-CM | POA: Diagnosis not present

## 2020-11-08 DIAGNOSIS — Z961 Presence of intraocular lens: Secondary | ICD-10-CM | POA: Diagnosis not present

## 2020-12-05 ENCOUNTER — Other Ambulatory Visit: Payer: Self-pay | Admitting: Internal Medicine

## 2020-12-05 DIAGNOSIS — Z1231 Encounter for screening mammogram for malignant neoplasm of breast: Secondary | ICD-10-CM

## 2021-01-16 ENCOUNTER — Ambulatory Visit
Admission: RE | Admit: 2021-01-16 | Discharge: 2021-01-16 | Disposition: A | Discharge status: Home / Self Care | Payer: Medicare Other | Source: Ambulatory Visit | Attending: Internal Medicine | Admitting: Internal Medicine

## 2021-01-16 ENCOUNTER — Other Ambulatory Visit: Payer: Self-pay

## 2021-01-16 DIAGNOSIS — Z1231 Encounter for screening mammogram for malignant neoplasm of breast: Secondary | ICD-10-CM | POA: Diagnosis not present

## 2021-01-22 DIAGNOSIS — I10 Essential (primary) hypertension: Secondary | ICD-10-CM | POA: Diagnosis not present

## 2021-01-22 DIAGNOSIS — D6869 Other thrombophilia: Secondary | ICD-10-CM | POA: Diagnosis not present

## 2021-01-22 DIAGNOSIS — I480 Paroxysmal atrial fibrillation: Secondary | ICD-10-CM | POA: Diagnosis not present

## 2021-01-22 DIAGNOSIS — J302 Other seasonal allergic rhinitis: Secondary | ICD-10-CM | POA: Diagnosis not present

## 2021-01-22 DIAGNOSIS — M8580 Other specified disorders of bone density and structure, unspecified site: Secondary | ICD-10-CM | POA: Diagnosis not present

## 2021-01-22 DIAGNOSIS — R7301 Impaired fasting glucose: Secondary | ICD-10-CM | POA: Diagnosis not present

## 2021-01-22 DIAGNOSIS — E785 Hyperlipidemia, unspecified: Secondary | ICD-10-CM | POA: Diagnosis not present

## 2021-01-22 DIAGNOSIS — G609 Hereditary and idiopathic neuropathy, unspecified: Secondary | ICD-10-CM | POA: Diagnosis not present

## 2021-01-23 ENCOUNTER — Other Ambulatory Visit: Payer: Self-pay | Admitting: Internal Medicine

## 2021-01-23 DIAGNOSIS — R928 Other abnormal and inconclusive findings on diagnostic imaging of breast: Secondary | ICD-10-CM

## 2021-01-31 DIAGNOSIS — M7989 Other specified soft tissue disorders: Secondary | ICD-10-CM | POA: Diagnosis not present

## 2021-01-31 DIAGNOSIS — Z8739 Personal history of other diseases of the musculoskeletal system and connective tissue: Secondary | ICD-10-CM | POA: Diagnosis not present

## 2021-01-31 DIAGNOSIS — M79672 Pain in left foot: Secondary | ICD-10-CM | POA: Diagnosis not present

## 2021-02-02 ENCOUNTER — Encounter: Payer: Self-pay | Admitting: Internal Medicine

## 2021-02-02 ENCOUNTER — Other Ambulatory Visit: Payer: Self-pay

## 2021-02-02 ENCOUNTER — Ambulatory Visit (INDEPENDENT_AMBULATORY_CARE_PROVIDER_SITE_OTHER): Payer: Medicare Other | Admitting: Internal Medicine

## 2021-02-02 VITALS — BP 118/68 | HR 73 | Ht 68.5 in | Wt 185.0 lb

## 2021-02-02 DIAGNOSIS — I4821 Permanent atrial fibrillation: Secondary | ICD-10-CM | POA: Diagnosis not present

## 2021-02-02 DIAGNOSIS — Z7901 Long term (current) use of anticoagulants: Secondary | ICD-10-CM

## 2021-02-02 DIAGNOSIS — I10 Essential (primary) hypertension: Secondary | ICD-10-CM

## 2021-02-02 DIAGNOSIS — E782 Mixed hyperlipidemia: Secondary | ICD-10-CM | POA: Diagnosis not present

## 2021-02-02 NOTE — Patient Instructions (Signed)

## 2021-02-03 NOTE — Progress Notes (Signed)
OFFICE NOTE  Chief Complaint:  No complaints  Primary Care Physician: Crist Infante, MD  HPI:  Sheena Benson is an 87 year old female now whom I have been following for atrial fibrillation which is persistent, is not chronic. She had a TEE cardioversion which restored sinus but quickly went back into AFib, was tried on Multaq and was intolerant of that, and actually was not really certain that she was aware of her AFib. Then we decided to pursue rate control and she has done very well with that. She has been therapeutic in her INRs on Coumadin without any bleeding problems. She is without complaints. Her INR was checked today and it was 1.9.  Sheena Benson returns today for follow-up. She is having no complaints. Her INR has been well controlled and followed by Erasmo Downer, our anticoagulation pharmacist. Recently she had a upper respiratory infection and required a Z-Pak as well as prednisone and Tessalon Perles. She still has some cough but is improving.  Sheena Benson returns today for follow-up. Overall she's doing well. She denies any chest pain or worsening shortness of breath. INR has been stable. She remains in A. fib which is rate controlled. Blood pressures is at goal.  12/31/2016  Sheena Benson returns today for follow-up. Her last year she is without any complaints. She denies any chest pain or worsening shortness of breath. Her INR has been consistently at goal every 6 weeks. Today was 2.4 is due for a recheck his next month. Blood pressure is well controlled. Cholesterol is followed by her primary care provider. She remains in permanent atrial fibrillation.  01/06/2018  Sheena Benson was seen today in follow-up.  Again she is doing well.  She denies any chest pain or shortness of breath.  Her A. fib has been rate controlled.  Her INR is due for check today.  Generally has been stable.  She recently had some bleeding which she cut herself on a can lid however otherwise is done well.  01/23/2019  Sheena Benson is seen  today in annual follow-up.  She continues to do well.  She does note that she has to nap during the day but in general has pretty good energy level.  She remains in permanent A. fib at 86.  Blood pressure is well controlled.  Her PCP follows her dyslipidemia.  01/26/2020  Sheena Benson returns today for follow-up.  No new issues related to A. fib.  She is tolerating Eliquis which she likes better than warfarin.  Cholesterol is well controlled.  Her blood pressure has been at target.  She is not yet received her COVID-19 vaccine however she is interested and will be looking into it.  02/03/2021  Sheena Benson returns today for follow-up.  She continues to do well.  Blood pressures well controlled today.  She remains in A. fib which is rate controlled.  She denies any bleeding issues on Eliquis.  Cholesterol was reasonably well controlled in August 2021 with total of 140, HDL 42, triglycerides 103 and LDL 77.  She is on simvastatin 10 mg daily.  PMHx:  Past Medical History:  Diagnosis Date  . Atrial fibrillation ([place])    intolerant to Multaq  . CAD (coronary artery disease)    mild  . History of nuclear stress test 08/2010   dipyridamole; mild ischemia in mid anterior & apical anterior regions; post-stress EF 72%  . Hyperlipidemia   . Hypertension   . Uterine cancer ([place]) 1964    Past Surgical History:  Procedure Laterality Date  .  ABDOMINAL HYSTERECTOMY  1964  . FRACTURE SURGERY     foot  . TEE WITH CARDIOVERSION  08/07/2010   EF 55-60%; mild MR; SR back into AF (Dr. K. Mali Evangaline Jou)     FAMHx:  Family History  Problem Relation Age of Onset  . Heart disease Mother     SOCHx:   reports that she has never smoked. She has never used smokeless tobacco. She reports that she does not drink alcohol and does not use drugs.  ALLERGIES:  No Known Allergies  ROS: Pertinent items noted in HPI and remainder of comprehensive ROS otherwise negative.  HOME MEDS: Current Outpatient Medications  Medication  Sig Dispense Refill  . ELIQUIS 5 MG TABS tablet TAKE 1 TABLET TWICE A DAY (Patient taking differently: Take 5 mg by mouth 2 (two) times daily.) 180 tablet 3  . lisinopril (PRINIVIL,ZESTRIL) 20 MG tablet Take 20 mg by mouth at bedtime.   11  . loratadine (CLARITIN) 10 MG tablet Take 10 mg by mouth daily as needed (for seasonal allergies).    . meclizine (ANTIVERT) 25 MG tablet Take 25 mg by mouth 3 (three) times daily as needed (for vertigo).   1  . metoprolol succinate (TOPROL-XL) 25 MG 24 hr tablet Take 25 mg by mouth at bedtime.    . Multiple Vitamins-Minerals (MULTIVITAMIN WITH MINERALS) tablet Take 1 tablet by mouth daily.    . simvastatin (ZOCOR) 10 MG tablet Take 10 mg by mouth at bedtime.     . TOPROL XL 25 MG 24 hr tablet Take 25 mg by mouth daily.    . vitamin B-12 (CYANOCOBALAMIN) 250 MCG tablet Take 250 mcg by mouth daily.     No current facility-administered medications for this visit.    LABS/IMAGING: No results found for this or any previous visit (from the past 48 hour(s)). No results found.  VITALS: BP 118/68   Pulse 73   Ht 5' 8.5" (1.74 m)   Wt 185 lb (83.9 kg)   BMI 27.72 kg/m   EXAM: General appearance: alert and no distress Neck: no carotid bruit and no JVD Lungs: clear to auscultation bilaterally Heart: irregularly irregular rhythm Abdomen: soft, non-tender; bowel sounds normal; no masses,  no organomegaly Extremities: extremities normal, atraumatic, no cyanosis or edema Pulses: 2+ and symmetric Skin: Skin color, texture, turgor normal. No rashes or lesions  EKG: A. fib at 73, nonspecific ST changes-personally reviewed  ASSESSMENT: 1. Permanent atrial fibrillation 2. Hypertension - controlled 3. Chronic anticoagulation 4. Hyperlipidemia  PLAN: 1.   Sheena Benson is essentially unchanged from a cardiac standpoint compared to last year.  A. fib remains rate controlled.  Her blood pressure is controlled.  She denies any issues on her anticoagulation and  her lipids are well controlled as well.  No medication changes today.  Follow-up annually or sooner as necessary.  [name], MD, [facility], [place] Director of the Advanced Lipid Disorders &  Cardiovascular Risk Reduction Clinic Diplomate of the American Board of Clinical Lipidology Attending Cardiologist  Direct Dial: [phone]  Fax: [phone]  Website:  [email or web address] 02/03/2021, 4:17 PM

## 2021-02-06 ENCOUNTER — Other Ambulatory Visit: Payer: Self-pay

## 2021-02-06 ENCOUNTER — Ambulatory Visit
Admission: RE | Admit: 2021-02-06 | Discharge: 2021-02-06 | Disposition: A | Discharge status: Home / Self Care | Payer: Medicare Other | Source: Ambulatory Visit | Attending: Internal Medicine | Admitting: Internal Medicine

## 2021-02-06 DIAGNOSIS — R922 Inconclusive mammogram: Secondary | ICD-10-CM | POA: Diagnosis not present

## 2021-02-06 DIAGNOSIS — N6011 Diffuse cystic mastopathy of right breast: Secondary | ICD-10-CM | POA: Diagnosis not present

## 2021-02-06 DIAGNOSIS — R928 Other abnormal and inconclusive findings on diagnostic imaging of breast: Secondary | ICD-10-CM

## 2021-02-06 DIAGNOSIS — R921 Mammographic calcification found on diagnostic imaging of breast: Secondary | ICD-10-CM | POA: Diagnosis not present

## 2021-08-02 ENCOUNTER — Other Ambulatory Visit: Payer: Self-pay | Admitting: Internal Medicine

## 2021-08-02 DIAGNOSIS — I10 Essential (primary) hypertension: Secondary | ICD-10-CM

## 2021-08-03 DIAGNOSIS — E785 Hyperlipidemia, unspecified: Secondary | ICD-10-CM | POA: Diagnosis not present

## 2021-08-03 DIAGNOSIS — Z8739 Personal history of other diseases of the musculoskeletal system and connective tissue: Secondary | ICD-10-CM | POA: Diagnosis not present

## 2021-08-03 DIAGNOSIS — E559 Vitamin D deficiency, unspecified: Secondary | ICD-10-CM | POA: Diagnosis not present

## 2021-08-03 DIAGNOSIS — R7301 Impaired fasting glucose: Secondary | ICD-10-CM | POA: Diagnosis not present

## 2021-08-03 NOTE — Telephone Encounter (Signed)
SENDING TO [facility]AR[facility] POOL FOR PERMISSION TO ORDER METABOLIC PANEL AND CBC FOR ANNUAL DOAC MONITORING.  Prescription refill request for Eliquis received. Last office visit:hilty 02/02/21 Scr:0.800 mg/ 06/16/2019 OVERDUE Age: 40fWeight:83.9kg

## 2021-08-06 NOTE — Telephone Encounter (Signed)
Yes please place order for BMET and CBC and advise pt to come in as soon as she can as updated labs will be needed to determine correct dose of Eliquis.

## 2021-08-06 NOTE — Telephone Encounter (Signed)
Called and spoke w/pt to get labs and they stated that they would. Refill sent for 1 month and will await results for next fill

## 2021-08-07 DIAGNOSIS — I10 Essential (primary) hypertension: Secondary | ICD-10-CM | POA: Diagnosis not present

## 2021-08-07 LAB — CBC
Hematocrit: 42.9 % (ref 34.0–46.6)
Hemoglobin: 14.3 g/dL (ref 11.1–15.9)
MCH: 28.8 pg (ref 26.6–33.0)
MCHC: 33.3 g/dL (ref 31.5–35.7)
MCV: 86 fL (ref 79–97)
Platelets: 202 10*3/uL (ref 150–450)
RBC: 4.97 x10E6/uL (ref 3.77–5.28)
RDW: 13.2 % (ref 11.7–15.4)
WBC: 6.9 10*3/uL (ref 3.4–10.8)

## 2021-08-08 LAB — BASIC METABOLIC PANEL
BUN/Creatinine Ratio: 17 (ref 12–28)
BUN: 14 mg/dL (ref 8–27)
CO2: 28 mmol/L (ref 20–29)
Calcium: 9.3 mg/dL (ref 8.7–10.3)
Chloride: 100 mmol/L (ref 96–106)
Creatinine, Ser: 0.82 mg/dL (ref 0.57–1.00)
Glucose: 108 mg/dL — ABNORMAL HIGH (ref 65–99)
Potassium: 4.7 mmol/L (ref 3.5–5.2)
Sodium: 142 mmol/L (ref 134–144)
eGFR: 68 mL/min/{1.73_m2} (ref >59)

## 2021-08-16 DIAGNOSIS — R3121 Asymptomatic microscopic hematuria: Secondary | ICD-10-CM | POA: Diagnosis not present

## 2021-08-16 DIAGNOSIS — E785 Hyperlipidemia, unspecified: Secondary | ICD-10-CM | POA: Diagnosis not present

## 2021-08-16 DIAGNOSIS — R8290 Unspecified abnormal findings in urine: Secondary | ICD-10-CM | POA: Diagnosis not present

## 2021-08-16 DIAGNOSIS — M8580 Other specified disorders of bone density and structure, unspecified site: Secondary | ICD-10-CM | POA: Diagnosis not present

## 2021-08-16 DIAGNOSIS — Z0000 Encounter for general adult medical examination without abnormal findings: Secondary | ICD-10-CM | POA: Diagnosis not present

## 2021-08-16 DIAGNOSIS — R7301 Impaired fasting glucose: Secondary | ICD-10-CM | POA: Diagnosis not present

## 2021-08-16 DIAGNOSIS — Z1331 Encounter for screening for depression: Secondary | ICD-10-CM | POA: Diagnosis not present

## 2021-08-16 DIAGNOSIS — I480 Paroxysmal atrial fibrillation: Secondary | ICD-10-CM | POA: Diagnosis not present

## 2021-08-16 DIAGNOSIS — R82998 Other abnormal findings in urine: Secondary | ICD-10-CM | POA: Diagnosis not present

## 2021-08-16 DIAGNOSIS — G609 Hereditary and idiopathic neuropathy, unspecified: Secondary | ICD-10-CM | POA: Diagnosis not present

## 2021-08-16 DIAGNOSIS — Z23 Encounter for immunization: Secondary | ICD-10-CM | POA: Diagnosis not present

## 2021-08-16 DIAGNOSIS — D6869 Other thrombophilia: Secondary | ICD-10-CM | POA: Diagnosis not present

## 2021-08-16 DIAGNOSIS — I10 Essential (primary) hypertension: Secondary | ICD-10-CM | POA: Diagnosis not present

## 2021-08-16 DIAGNOSIS — E559 Vitamin D deficiency, unspecified: Secondary | ICD-10-CM | POA: Diagnosis not present

## 2021-08-22 ENCOUNTER — Encounter: Payer: Self-pay | Admitting: Internal Medicine

## 2021-08-28 ENCOUNTER — Other Ambulatory Visit: Payer: Self-pay | Admitting: Internal Medicine

## 2021-08-28 NOTE — Telephone Encounter (Signed)
Prescription refill request for Eliquis received. Indication: afib  Last office visit: 02/02/2021, Hilty Scr: 0.82, 08/07/2021 Age: 87 yo  Weight: 93.9 kg   Refill sent.

## 2021-11-09 DIAGNOSIS — H353132 Nonexudative age-related macular degeneration, bilateral, intermediate dry stage: Secondary | ICD-10-CM | POA: Diagnosis not present

## 2021-11-09 DIAGNOSIS — H52203 Unspecified astigmatism, bilateral: Secondary | ICD-10-CM | POA: Diagnosis not present

## 2021-11-09 DIAGNOSIS — H0100A Unspecified blepharitis right eye, upper and lower eyelids: Secondary | ICD-10-CM | POA: Diagnosis not present

## 2021-11-09 DIAGNOSIS — H43813 Vitreous degeneration, bilateral: Secondary | ICD-10-CM | POA: Diagnosis not present

## 2021-11-23 ENCOUNTER — Telehealth: Payer: Self-pay | Admitting: Internal Medicine

## 2021-11-23 NOTE — Telephone Encounter (Signed)
PA for eliquis completed, faxed to [facility] at [phone]

## 2021-11-27 NOTE — Telephone Encounter (Signed)
Approved 10/24/21 - 11/23/22

## 2022-01-07 ENCOUNTER — Other Ambulatory Visit: Payer: Self-pay | Admitting: Internal Medicine

## 2022-01-07 DIAGNOSIS — Z1231 Encounter for screening mammogram for malignant neoplasm of breast: Secondary | ICD-10-CM

## 2022-01-22 ENCOUNTER — Ambulatory Visit: Payer: Medicare Other

## 2022-01-30 ENCOUNTER — Ambulatory Visit
Admission: RE | Admit: 2022-01-30 | Discharge: 2022-01-30 | Disposition: A | Discharge status: Home / Self Care | Payer: Medicare Other | Source: Ambulatory Visit | Attending: Internal Medicine | Admitting: Internal Medicine

## 2022-01-30 DIAGNOSIS — Z1231 Encounter for screening mammogram for malignant neoplasm of breast: Secondary | ICD-10-CM | POA: Diagnosis not present

## 2022-02-12 ENCOUNTER — Other Ambulatory Visit: Payer: Self-pay | Admitting: Internal Medicine

## 2022-02-12 DIAGNOSIS — I4821 Permanent atrial fibrillation: Secondary | ICD-10-CM

## 2022-02-12 NOTE — Telephone Encounter (Signed)
Prescription refill request for Eliquis received. ?Indication: a fib ?Last office visit: 02/12/22. Has appt scheduled ?Scr: 0.8 ?Age: 88 ?Weight: 83kg ?

## 2022-02-18 ENCOUNTER — Ambulatory Visit: Payer: Medicare Other | Admitting: Physician Assistant

## 2022-02-21 ENCOUNTER — Ambulatory Visit (INDEPENDENT_AMBULATORY_CARE_PROVIDER_SITE_OTHER): Payer: Medicare Other | Admitting: Internal Medicine

## 2022-02-21 ENCOUNTER — Encounter: Payer: Self-pay | Admitting: Internal Medicine

## 2022-02-21 VITALS — BP 104/70 | HR 85 | Ht 68.5 in | Wt 183.0 lb

## 2022-02-21 DIAGNOSIS — I4821 Permanent atrial fibrillation: Secondary | ICD-10-CM

## 2022-02-21 DIAGNOSIS — I10 Essential (primary) hypertension: Secondary | ICD-10-CM

## 2022-02-21 DIAGNOSIS — Z7901 Long term (current) use of anticoagulants: Secondary | ICD-10-CM | POA: Diagnosis not present

## 2022-02-21 DIAGNOSIS — E782 Mixed hyperlipidemia: Secondary | ICD-10-CM | POA: Diagnosis not present

## 2022-02-21 NOTE — Patient Instructions (Signed)
Medication Instructions:  ?Your physician recommends that you continue on your current medications as directed. Please refer to the Current Medication list given to you today. ? ?*If you need a refill on your cardiac medications before your next appointment, please call your pharmacy* ? ? ?Follow-Up: ?At [facility], you and your health needs are our priority.  As part of our continuing mission to provide you with exceptional heart care, we have created designated Provider Care Teams.  These Care Teams include your primary Cardiologist (physician) and Advanced Practice Providers (APPs -  Physician Assistants and Nurse Practitioners) who all work together to provide you with the care you need, when you need it. ? ?We recommend signing up for the patient portal called "MyChart".  Sign up information is provided on this After Visit Summary.  MyChart is used to connect with patients for Virtual Visits (Telemedicine).  Patients are able to view lab/test results, encounter notes, upcoming appointments, etc.  Non-urgent messages can be sent to your provider as well.   ?To learn more about what you can do with MyChart, go to [email or web address].   ? ?Your next appointment:   ?12 month(s) ? ?The format for your next appointment:   ?In Person ? ?Provider:   ?Dr. Lyman Bishop ? ?

## 2022-02-21 NOTE — Progress Notes (Signed)
? ? ?OFFICE NOTE ? ?Chief Complaint:  ?No complaints ? ?Primary Care Physician: ?Crist Infante, MD ? ?HPI:  ?Sheena Benson is an 88 year old female now whom I have been following for atrial fibrillation which is persistent, is not chronic. She had a TEE cardioversion which restored sinus but quickly went back into AFib, was tried on Multaq and was intolerant of that, and actually was not really certain that she was aware of her AFib. Then we decided to pursue rate control and she has done very well with that. She has been therapeutic in her INRs on Coumadin without any bleeding problems. She is without complaints. Her INR was checked today and it was 1.9. ? ?Sheena Benson returns today for follow-up. She is having no complaints. Her INR has been well controlled and followed by Erasmo Downer, our anticoagulation pharmacist. Recently she had a upper respiratory infection and required a Z-Pak as well as prednisone and Tessalon Perles. She still has some cough but is improving. ? ?Sheena Benson returns today for follow-up. Overall she's doing well. She denies any chest pain or worsening shortness of breath. INR has been stable. She remains in A. fib which is rate controlled. Blood pressures is at goal. ? ?12/31/2016 ? ?Sheena Benson returns today for follow-up. Her last year she is without any complaints. She denies any chest pain or worsening shortness of breath. Her INR has been consistently at goal every 6 weeks. Today was 2.4 is due for a recheck his next month. Blood pressure is well controlled. Cholesterol is followed by her primary care provider. She remains in permanent atrial fibrillation. ? ?01/06/2018 ? ?Sheena Benson was seen today in follow-up.  Again she is doing well.  She denies any chest pain or shortness of breath.  Her A. fib has been rate controlled.  Her INR is due for check today.  Generally has been stable.  She recently had some bleeding which she cut herself on a can lid however otherwise is done well. ? ?01/23/2019 ? ?Sheena Benson is seen  today in annual follow-up.  She continues to do well.  She does note that she has to nap during the day but in general has pretty good energy level.  She remains in permanent A. fib at 88.  Blood pressure is well controlled.  Her PCP follows her dyslipidemia. ? ?01/26/2020 ? ?Sheena Benson returns today for follow-up.  No new issues related to A. fib.  She is tolerating Eliquis which she likes better than warfarin.  Cholesterol is well controlled.  Her blood pressure has been at target.  She is not yet received her COVID-19 vaccine however she is interested and will be looking into it. ? ?02/03/2021 ? ?Sheena Benson returns today for follow-up.  She continues to do well.  Blood pressures well controlled today.  She remains in A. fib which is rate controlled.  She denies any bleeding issues on Eliquis.  Cholesterol was reasonably well controlled in August 2021 with total of 140, HDL 42, triglycerides 103 and LDL 77.  She is on simvastatin 10 mg daily. ? ?02/21/2022 ? ?Sheena Benson returns today again for follow-up.  She continues to do well amazingly now at 88 years old.  Blood pressure is well controlled.  She has no issues with her A-fib which is permanent at this point and rate controlled.  She has no bleeding issues.  She denies chest pain or worsening shortness of breath.  Her lipids are well controlled back in September with an LDL 67.  She denies any bleeding  on Eliquis. ? ?PMHx:  ?Past Medical History:  ?Diagnosis Date  ? Atrial fibrillation ([place])   ? intolerant to Multaq  ? CAD (coronary artery disease)   ? mild  ? History of nuclear stress test 08/2010  ? dipyridamole; mild ischemia in mid anterior & apical anterior regions; post-stress EF 72%  ? Hyperlipidemia   ? Hypertension   ? Uterine cancer ([place]) 1964  ? ? ?Past Surgical History:  ?Procedure Laterality Date  ? ABDOMINAL HYSTERECTOMY  1964  ? FRACTURE SURGERY    ? foot  ? TEE WITH CARDIOVERSION  08/07/2010  ? EF 55-60%; mild MR; SR back into AF (Dr. K. [place] Terran Klinke)   ? ? ?FAMHx:   ?Family History  ?Problem Relation Age of Onset  ? Heart disease Mother   ? ? ?SOCHx:  ? reports that she has never smoked. She has never used smokeless tobacco. She reports that she does not drink alcohol and does not use drugs. ? ?ALLERGIES:  ?No Known Allergies ? ?ROS: ?Pertinent items noted in HPI and remainder of comprehensive ROS otherwise negative. ? ?HOME MEDS: ?Current Outpatient Medications  ?Medication Sig Dispense Refill  ? apixaban (ELIQUIS) 5 MG TABS tablet TAKE 1 TABLET TWICE A DAY 180 tablet 0  ? lisinopril (PRINIVIL,ZESTRIL) 20 MG tablet Take 20 mg by mouth at bedtime.   11  ? loratadine (CLARITIN) 10 MG tablet Take 10 mg by mouth daily as needed (for seasonal allergies).    ? meclizine (ANTIVERT) 25 MG tablet Take 25 mg by mouth 3 (three) times daily as needed (for vertigo).   1  ? metoprolol succinate (TOPROL-XL) 25 MG 24 hr tablet Take 25 mg by mouth at bedtime.    ? Multiple Vitamins-Minerals (MULTIVITAMIN WITH MINERALS) tablet Take 1 tablet by mouth daily.    ? simvastatin (ZOCOR) 10 MG tablet Take 10 mg by mouth at bedtime.     ? TOPROL XL 25 MG 24 hr tablet Take 25 mg by mouth daily.    ? vitamin B-12 (CYANOCOBALAMIN) 250 MCG tablet Take 250 mcg by mouth daily.    ? ?No current facility-administered medications for this visit.  ? ? ?LABS/IMAGING: ?No results found for this or any previous visit (from the past 48 hour(s)). ?No results found. ? ?VITALS: ?BP 104/70 (BP Location: Left Arm, Patient Position: Sitting, Cuff Size: Normal)   Pulse 85   Ht 5' 8.5" (1.74 m)   Wt 183 lb (83 kg)   BMI 27.42 kg/m?  ? ?EXAM: ?General appearance: alert and no distress ?Neck: no carotid bruit and no JVD ?Lungs: clear to auscultation bilaterally ?Heart: irregularly irregular rhythm ?Abdomen: soft, non-tender; bowel sounds normal; no masses,  no organomegaly ?Extremities: extremities normal, atraumatic, no cyanosis or edema ?Pulses: 2+ and symmetric ?Skin: Skin color, texture, turgor normal. No rashes or  lesions ? ?EKG: ?A-fib at 87-personally reviewed ? ?ASSESSMENT: ?Permanent atrial fibrillation ?Hypertension - controlled ?Chronic anticoagulation ?Hyperlipidemia ? ?PLAN: ?1.   Mrs. Horney continues to do amazingly well even at 88 years old.  She is tolerating anticoagulation on Eliquis which is the appropriate dose for age and renal function.  Cholesterol is very well controlled.  A-fib is rate controlled and permanent.  Her blood pressure is at goal as well.  No medication changes are suggested at this time.  Plan follow-up with me annually or sooner as necessary. ? ?[name], MD, [facility], FACP  ?[place]  ?Medical Director of the Advanced Lipid Disorders &  ?  Cardiovascular Risk Reduction Clinic ?Diplomate of the [employer] of Clinical Lipidology ?Attending Cardiologist  ?Direct Dial: [phone]  Fax: [phone]  ?Website:  [email or web address] ? ?Nadean Corwin Gabbrielle Mcnicholas ?02/21/2022, 8:23 AM ?

## 2022-04-05 DIAGNOSIS — J069 Acute upper respiratory infection, unspecified: Secondary | ICD-10-CM | POA: Diagnosis not present

## 2022-04-05 DIAGNOSIS — I10 Essential (primary) hypertension: Secondary | ICD-10-CM | POA: Diagnosis not present

## 2022-04-05 DIAGNOSIS — J302 Other seasonal allergic rhinitis: Secondary | ICD-10-CM | POA: Diagnosis not present

## 2022-04-05 DIAGNOSIS — R051 Acute cough: Secondary | ICD-10-CM | POA: Diagnosis not present

## 2022-04-05 DIAGNOSIS — I480 Paroxysmal atrial fibrillation: Secondary | ICD-10-CM | POA: Diagnosis not present

## 2022-05-14 ENCOUNTER — Other Ambulatory Visit: Payer: Self-pay | Admitting: Internal Medicine

## 2022-05-14 DIAGNOSIS — I4821 Permanent atrial fibrillation: Secondary | ICD-10-CM

## 2022-05-14 NOTE — Telephone Encounter (Signed)
Prescription refill request for Eliquis received. Indication:Afib  Last office visit:02/21/22 (Hilty)  Scr: 0.82 (08/07/21)  Age: 88 Weight: 83kg  Appropriate dose and refill sent to requested pharmacy.

## 2022-08-27 DIAGNOSIS — E559 Vitamin D deficiency, unspecified: Secondary | ICD-10-CM | POA: Diagnosis not present

## 2022-08-27 DIAGNOSIS — R7989 Other specified abnormal findings of blood chemistry: Secondary | ICD-10-CM | POA: Diagnosis not present

## 2022-08-27 DIAGNOSIS — E785 Hyperlipidemia, unspecified: Secondary | ICD-10-CM | POA: Diagnosis not present

## 2022-08-27 DIAGNOSIS — R7301 Impaired fasting glucose: Secondary | ICD-10-CM | POA: Diagnosis not present

## 2022-08-27 DIAGNOSIS — I10 Essential (primary) hypertension: Secondary | ICD-10-CM | POA: Diagnosis not present

## 2022-08-27 DIAGNOSIS — M8589 Other specified disorders of bone density and structure, multiple sites: Secondary | ICD-10-CM | POA: Diagnosis not present

## 2022-09-04 DIAGNOSIS — D6869 Other thrombophilia: Secondary | ICD-10-CM | POA: Diagnosis not present

## 2022-09-04 DIAGNOSIS — R82998 Other abnormal findings in urine: Secondary | ICD-10-CM | POA: Diagnosis not present

## 2022-09-04 DIAGNOSIS — E785 Hyperlipidemia, unspecified: Secondary | ICD-10-CM | POA: Diagnosis not present

## 2022-09-04 DIAGNOSIS — K449 Diaphragmatic hernia without obstruction or gangrene: Secondary | ICD-10-CM | POA: Diagnosis not present

## 2022-09-04 DIAGNOSIS — M8580 Other specified disorders of bone density and structure, unspecified site: Secondary | ICD-10-CM | POA: Diagnosis not present

## 2022-09-04 DIAGNOSIS — G609 Hereditary and idiopathic neuropathy, unspecified: Secondary | ICD-10-CM | POA: Diagnosis not present

## 2022-09-04 DIAGNOSIS — I10 Essential (primary) hypertension: Secondary | ICD-10-CM | POA: Diagnosis not present

## 2022-09-04 DIAGNOSIS — Z23 Encounter for immunization: Secondary | ICD-10-CM | POA: Diagnosis not present

## 2022-09-04 DIAGNOSIS — I480 Paroxysmal atrial fibrillation: Secondary | ICD-10-CM | POA: Diagnosis not present

## 2022-09-04 DIAGNOSIS — R7301 Impaired fasting glucose: Secondary | ICD-10-CM | POA: Diagnosis not present

## 2022-09-04 DIAGNOSIS — Z0000 Encounter for general adult medical examination without abnormal findings: Secondary | ICD-10-CM | POA: Diagnosis not present

## 2022-09-18 DIAGNOSIS — Z23 Encounter for immunization: Secondary | ICD-10-CM | POA: Diagnosis not present

## 2022-11-13 DIAGNOSIS — Z961 Presence of intraocular lens: Secondary | ICD-10-CM | POA: Diagnosis not present

## 2022-11-13 DIAGNOSIS — H353132 Nonexudative age-related macular degeneration, bilateral, intermediate dry stage: Secondary | ICD-10-CM | POA: Diagnosis not present

## 2022-11-13 DIAGNOSIS — H43813 Vitreous degeneration, bilateral: Secondary | ICD-10-CM | POA: Diagnosis not present

## 2023-01-03 ENCOUNTER — Other Ambulatory Visit: Payer: Self-pay | Admitting: Internal Medicine

## 2023-01-03 DIAGNOSIS — Z1231 Encounter for screening mammogram for malignant neoplasm of breast: Secondary | ICD-10-CM

## 2023-02-14 ENCOUNTER — Ambulatory Visit
Admission: RE | Admit: 2023-02-14 | Discharge: 2023-02-14 | Disposition: A | Discharge status: Home / Self Care | Payer: Medicare Other | Source: Ambulatory Visit | Attending: Internal Medicine | Admitting: Internal Medicine

## 2023-02-14 DIAGNOSIS — Z1231 Encounter for screening mammogram for malignant neoplasm of breast: Secondary | ICD-10-CM

## 2023-04-22 DIAGNOSIS — G609 Hereditary and idiopathic neuropathy, unspecified: Secondary | ICD-10-CM | POA: Diagnosis not present

## 2023-04-22 DIAGNOSIS — E785 Hyperlipidemia, unspecified: Secondary | ICD-10-CM | POA: Diagnosis not present

## 2023-04-22 DIAGNOSIS — R7301 Impaired fasting glucose: Secondary | ICD-10-CM | POA: Diagnosis not present

## 2023-04-22 DIAGNOSIS — D6869 Other thrombophilia: Secondary | ICD-10-CM | POA: Diagnosis not present

## 2023-04-22 DIAGNOSIS — K449 Diaphragmatic hernia without obstruction or gangrene: Secondary | ICD-10-CM | POA: Diagnosis not present

## 2023-04-22 DIAGNOSIS — E559 Vitamin D deficiency, unspecified: Secondary | ICD-10-CM | POA: Diagnosis not present

## 2023-04-22 DIAGNOSIS — I480 Paroxysmal atrial fibrillation: Secondary | ICD-10-CM | POA: Diagnosis not present

## 2023-04-22 DIAGNOSIS — Z1152 Encounter for screening for COVID-19: Secondary | ICD-10-CM | POA: Diagnosis not present

## 2023-04-22 DIAGNOSIS — R051 Acute cough: Secondary | ICD-10-CM | POA: Diagnosis not present

## 2023-04-22 DIAGNOSIS — I10 Essential (primary) hypertension: Secondary | ICD-10-CM | POA: Diagnosis not present

## 2023-05-16 ENCOUNTER — Ambulatory Visit: Payer: Medicare Other | Attending: Internal Medicine | Admitting: Internal Medicine

## 2023-05-16 VITALS — BP 108/82 | HR 80 | Ht 68.0 in | Wt 172.8 lb

## 2023-05-16 DIAGNOSIS — I4821 Permanent atrial fibrillation: Secondary | ICD-10-CM | POA: Diagnosis not present

## 2023-05-16 DIAGNOSIS — I10 Essential (primary) hypertension: Secondary | ICD-10-CM | POA: Insufficient documentation

## 2023-05-16 DIAGNOSIS — Z7901 Long term (current) use of anticoagulants: Secondary | ICD-10-CM | POA: Diagnosis not present

## 2023-05-16 DIAGNOSIS — E782 Mixed hyperlipidemia: Secondary | ICD-10-CM | POA: Diagnosis not present

## 2023-05-16 NOTE — Patient Instructions (Signed)
Medication Instructions:  NO CHANGES  *If you need a refill on your cardiac medications before your next appointment, please call your pharmacy*   Follow-Up: At [facility], you and your health needs are our priority.  As part of our continuing mission to provide you with exceptional heart care, we have created designated Provider Care Teams.  These Care Teams include your primary Cardiologist (physician) and Advanced Practice Providers (APPs -  Physician Assistants and Nurse Practitioners) who all work together to provide you with the care you need, when you need it.  We recommend signing up for the patient portal called "MyChart".  Sign up information is provided on this After Visit Summary.  MyChart is used to connect with patients for Virtual Visits (Telemedicine).  Patients are able to view lab/test results, encounter notes, upcoming appointments, etc.  Non-urgent messages can be sent to your provider as well.   To learn more about what you can do with MyChart, go to [email or web address].    Your next appointment:    12 months with Dr. Rennis Golden  ** call in Jan/Feb 2025 for your appointment

## 2023-05-16 NOTE — Progress Notes (Signed)
OFFICE NOTE  Chief Complaint:  No complaints  Primary Care Physician: Rodrigo Ran, MD  HPI:  Sheena Benson is an 89 year old female now whom I have been following for atrial fibrillation which is persistent, is not chronic. She had a TEE cardioversion which restored sinus but quickly went back into AFib, was tried on Multaq and was intolerant of that, and actually was not really certain that she was aware of her AFib. Then we decided to pursue rate control and she has done very well with that. She has been therapeutic in her INRs on Coumadin without any bleeding problems. She is without complaints. Her INR was checked today and it was 1.9.  Sheena Benson returns today for follow-up. She is having no complaints. Her INR has been well controlled and followed by Belenda Cruise, our anticoagulation pharmacist. Recently she had a upper respiratory infection and required a Z-Pak as well as prednisone and Tessalon Perles. She still has some cough but is improving.  Sheena Benson returns today for follow-up. Overall she's doing well. She denies any chest pain or worsening shortness of breath. INR has been stable. She remains in A. fib which is rate controlled. Blood pressures is at goal.  12/31/2016  Sheena Benson returns today for follow-up. Her last year she is without any complaints. She denies any chest pain or worsening shortness of breath. Her INR has been consistently at goal every 6 weeks. Today was 2.4 is due for a recheck his next month. Blood pressure is well controlled. Cholesterol is followed by her primary care provider. She remains in permanent atrial fibrillation.  01/06/2018  Sheena Benson was seen today in follow-up.  Again she is doing well.  She denies any chest pain or shortness of breath.  Her A. fib has been rate controlled.  Her INR is due for check today.  Generally has been stable.  She recently had some bleeding which she cut herself on a can lid however otherwise is done well.  01/23/2019  Sheena Benson is seen  today in annual follow-up.  She continues to do well.  She does note that she has to nap during the day but in general has pretty good energy level.  She remains in permanent A. fib at 89.  Blood pressure is well controlled.  Her PCP follows her dyslipidemia.  01/26/2020  Sheena Benson returns today for follow-up.  No new issues related to A. fib.  She is tolerating Eliquis which she likes better than warfarin.  Cholesterol is well controlled.  Her blood pressure has been at target.  She is not yet received her COVID-19 vaccine however she is interested and will be looking into it.  02/03/2021  Sheena Benson returns today for follow-up.  She continues to do well.  Blood pressures well controlled today.  She remains in A. fib which is rate controlled.  She denies any bleeding issues on Eliquis.  Cholesterol was reasonably well controlled in August 2021 with total of 140, HDL 42, triglycerides 103 and LDL 77.  She is on simvastatin 10 mg daily.  02/21/2022  Sheena Benson returns today again for follow-up.  She continues to do well amazingly now at 89 years old.  Blood pressure is well controlled.  She has no issues with her A-fib which is permanent at this point and rate controlled.  She has no bleeding issues.  She denies chest pain or worsening shortness of breath.  Her lipids are well controlled back in September with an LDL 67.  She denies any bleeding  on Eliquis.  05/16/2023  Sheena Benson is seen today in follow-up. She is now 89, but is quite active and drives herself.  She denies any bleeding issues on Eliquis.  Her A-fib is rate controlled.  She has no chest pain or worsening shortness of breath.  Her LDL is well-controlled at 70.  PMHx:  Past Medical History:  Diagnosis Date   Atrial fibrillation ([facility])    intolerant to Multaq   CAD (coronary artery disease)    mild   History of nuclear stress test 08/2010   dipyridamole; mild ischemia in mid anterior & apical anterior regions; post-stress EF 72%   Hyperlipidemia     Hypertension    Uterine cancer ([facility]) 1964    Past Surgical History:  Procedure Laterality Date   ABDOMINAL HYSTERECTOMY  1964   FRACTURE SURGERY     foot   TEE WITH CARDIOVERSION  08/07/2010   EF 55-60%; mild MR; SR back into AF (Dr. K. [place] Telma Pyeatt)     FAMHx:  Family History  Problem Relation Age of Onset   Heart disease Mother     SOCHx:   reports that she has never smoked. She has never used smokeless tobacco. She reports that she does not drink alcohol and does not use drugs.  ALLERGIES:  No Known Allergies  ROS: Pertinent items noted in HPI and remainder of comprehensive ROS otherwise negative.  HOME MEDS: Current Outpatient Medications  Medication Sig Dispense Refill   Cholecalciferol (D3 ADULT PO) Take by mouth.     ELIQUIS 5 MG TABS tablet TAKE 1 TABLET TWICE A DAY 180 tablet 3   lisinopril (PRINIVIL,ZESTRIL) 20 MG tablet Take 20 mg by mouth at bedtime.   11   loratadine (CLARITIN) 10 MG tablet Take 10 mg by mouth daily as needed (for seasonal allergies).     meclizine (ANTIVERT) 25 MG tablet Take 25 mg by mouth 3 (three) times daily as needed (for vertigo).   1   metoprolol succinate (TOPROL-XL) 25 MG 24 hr tablet Take 25 mg by mouth at bedtime.     Multiple Vitamins-Minerals (MULTIVITAMIN WITH MINERALS) tablet Take 1 tablet by mouth daily.     simvastatin (ZOCOR) 10 MG tablet Take 10 mg by mouth at bedtime.      vitamin B-12 (CYANOCOBALAMIN) 250 MCG tablet Take 250 mcg by mouth daily.     No current facility-administered medications for this visit.    LABS/IMAGING: No results found for this or any previous visit (from the past 48 hour(s)). No results found.  VITALS: BP 108/82 (BP Location: Left Arm, Patient Position: Sitting, Cuff Size: Normal)   Pulse 80   Ht 5\' 8"  (1.727 m)   Wt 172 lb 12.8 oz (78.4 kg)   SpO2 96%   BMI 26.27 kg/m   EXAM: General appearance: alert and no distress Neck: no carotid bruit and no JVD Lungs: clear to auscultation  bilaterally Heart: irregularly irregular rhythm Abdomen: soft, non-tender; bowel sounds normal; no masses,  no organomegaly Extremities: extremities normal, atraumatic, no cyanosis or edema Pulses: 2+ and symmetric Skin: Skin color, texture, turgor normal. No rashes or lesions  EKG: EKG Interpretation  Date/Time:  Friday May 16 2023 15:49:47 EDT Ventricular Rate:  80 PR Interval:    QRS Duration: 90 QT Interval:  372 QTC Calculation: 429 R Axis:   47 Text Interpretation: Atrial fibrillation Nonspecific ST abnormality When compared with ECG of 07-Aug-2010 14:24, Atrial fibrillation has replaced Sinus rhythm Confirmed by Zoila Shutter [phone]) on  05/16/2023 4:05:05 PM   ASSESSMENT: Permanent atrial fibrillation Hypertension - controlled Chronic anticoagulation Hyperlipidemia  PLAN: 1.   Mrs. Scantlin is doing quite well and remains active now at 32.  She is in permanent A-fib with well-controlled hypertension.  No bleeding issues on Eliquis.  Her lipids are at goal.  No changes to her medicines today.  Plan follow-up annually or sooner as necessary.  Chrystie Nose, MD, [facility], FACP  [place]  [facility]  Medical Director of the Advanced Lipid Disorders &  Cardiovascular Risk Reduction Clinic Diplomate of the American Board of Clinical Lipidology Attending Cardiologist  Direct Dial: [phone]  Fax: [phone]  Website:  [email or web address] Alcides Nutting 05/16/2023, 4:04 PM

## 2023-05-20 ENCOUNTER — Other Ambulatory Visit: Payer: Self-pay | Admitting: Internal Medicine

## 2023-05-20 DIAGNOSIS — I4821 Permanent atrial fibrillation: Secondary | ICD-10-CM

## 2023-05-21 NOTE — Telephone Encounter (Addendum)
Eliquis 5mg  refill request received. Patient is 89 years old, weight-78.4kg, Crea-0.82 on 08/07/21-needs labs, Diagnosis-Afib, and last seen by Dr. Rennis Golden on 05/16/23. Dose is appropriate based on dosing criteria.   Needs labs  Called PCP at 1006am regarding updated labs

## 2023-05-21 NOTE — Telephone Encounter (Signed)
Labs received.  Scr 1.0 on 04/22/23.   Appropriate dose. Refill sent.

## 2023-05-22 ENCOUNTER — Telehealth: Payer: Self-pay | Admitting: Internal Medicine

## 2023-05-22 NOTE — Telephone Encounter (Signed)
Pt c/o medication issue:  1. Name of Medication: Chlorthalidone  2. How are you currently taking this medication (dosage and times per day)?   3. Are you having a reaction (difficulty breathing--STAT)?   4. What is your medication issue? Patient said she was supposed to caled back with the correct milligram. The correct milligram is 25 mgg

## 2023-09-03 DIAGNOSIS — R42 Dizziness and giddiness: Secondary | ICD-10-CM | POA: Diagnosis not present

## 2023-09-03 DIAGNOSIS — I10 Essential (primary) hypertension: Secondary | ICD-10-CM | POA: Diagnosis not present

## 2023-09-03 DIAGNOSIS — I480 Paroxysmal atrial fibrillation: Secondary | ICD-10-CM | POA: Diagnosis not present

## 2023-09-11 DIAGNOSIS — Z23 Encounter for immunization: Secondary | ICD-10-CM | POA: Diagnosis not present

## 2023-10-27 DIAGNOSIS — E559 Vitamin D deficiency, unspecified: Secondary | ICD-10-CM | POA: Diagnosis not present

## 2023-10-27 DIAGNOSIS — I10 Essential (primary) hypertension: Secondary | ICD-10-CM | POA: Diagnosis not present

## 2023-10-27 DIAGNOSIS — R7301 Impaired fasting glucose: Secondary | ICD-10-CM | POA: Diagnosis not present

## 2023-10-27 DIAGNOSIS — E785 Hyperlipidemia, unspecified: Secondary | ICD-10-CM | POA: Diagnosis not present

## 2023-11-07 DIAGNOSIS — I480 Paroxysmal atrial fibrillation: Secondary | ICD-10-CM | POA: Diagnosis not present

## 2023-11-07 DIAGNOSIS — Z0000 Encounter for general adult medical examination without abnormal findings: Secondary | ICD-10-CM | POA: Diagnosis not present

## 2023-11-07 DIAGNOSIS — D6869 Other thrombophilia: Secondary | ICD-10-CM | POA: Diagnosis not present

## 2023-11-07 DIAGNOSIS — I10 Essential (primary) hypertension: Secondary | ICD-10-CM | POA: Diagnosis not present

## 2023-11-07 DIAGNOSIS — R82998 Other abnormal findings in urine: Secondary | ICD-10-CM | POA: Diagnosis not present

## 2023-11-07 DIAGNOSIS — R3121 Asymptomatic microscopic hematuria: Secondary | ICD-10-CM | POA: Diagnosis not present

## 2023-11-07 DIAGNOSIS — E785 Hyperlipidemia, unspecified: Secondary | ICD-10-CM | POA: Diagnosis not present

## 2023-11-07 DIAGNOSIS — Z1389 Encounter for screening for other disorder: Secondary | ICD-10-CM | POA: Diagnosis not present

## 2023-11-07 DIAGNOSIS — R7301 Impaired fasting glucose: Secondary | ICD-10-CM | POA: Diagnosis not present

## 2023-11-07 DIAGNOSIS — M8580 Other specified disorders of bone density and structure, unspecified site: Secondary | ICD-10-CM | POA: Diagnosis not present

## 2023-11-07 DIAGNOSIS — E559 Vitamin D deficiency, unspecified: Secondary | ICD-10-CM | POA: Diagnosis not present

## 2023-11-07 DIAGNOSIS — G609 Hereditary and idiopathic neuropathy, unspecified: Secondary | ICD-10-CM | POA: Diagnosis not present

## 2023-11-28 DIAGNOSIS — H04123 Dry eye syndrome of bilateral lacrimal glands: Secondary | ICD-10-CM | POA: Diagnosis not present

## 2023-11-28 DIAGNOSIS — H353132 Nonexudative age-related macular degeneration, bilateral, intermediate dry stage: Secondary | ICD-10-CM | POA: Diagnosis not present

## 2023-11-28 DIAGNOSIS — H52203 Unspecified astigmatism, bilateral: Secondary | ICD-10-CM | POA: Diagnosis not present

## 2023-11-28 DIAGNOSIS — H35372 Puckering of macula, left eye: Secondary | ICD-10-CM | POA: Diagnosis not present

## 2023-11-28 DIAGNOSIS — H524 Presbyopia: Secondary | ICD-10-CM | POA: Diagnosis not present

## 2023-11-28 DIAGNOSIS — H43813 Vitreous degeneration, bilateral: Secondary | ICD-10-CM | POA: Diagnosis not present

## 2024-01-15 ENCOUNTER — Other Ambulatory Visit: Payer: Self-pay | Admitting: Internal Medicine

## 2024-01-15 DIAGNOSIS — Z1231 Encounter for screening mammogram for malignant neoplasm of breast: Secondary | ICD-10-CM

## 2024-02-17 ENCOUNTER — Ambulatory Visit
Admission: RE | Admit: 2024-02-17 | Discharge: 2024-02-17 | Disposition: A | Discharge status: Home / Self Care | Payer: Medicare Other | Source: Ambulatory Visit | Attending: Internal Medicine | Admitting: Internal Medicine

## 2024-02-17 DIAGNOSIS — Z1231 Encounter for screening mammogram for malignant neoplasm of breast: Secondary | ICD-10-CM

## 2024-05-18 ENCOUNTER — Encounter: Payer: Self-pay | Admitting: Internal Medicine

## 2024-05-18 ENCOUNTER — Other Ambulatory Visit: Payer: Self-pay | Admitting: Internal Medicine

## 2024-05-18 ENCOUNTER — Ambulatory Visit: Payer: Medicare Other | Attending: Internal Medicine | Admitting: Internal Medicine

## 2024-05-18 VITALS — BP 136/94 | HR 84 | Ht 68.0 in

## 2024-05-18 DIAGNOSIS — I10 Essential (primary) hypertension: Secondary | ICD-10-CM | POA: Diagnosis not present

## 2024-05-18 DIAGNOSIS — I4821 Permanent atrial fibrillation: Secondary | ICD-10-CM | POA: Diagnosis not present

## 2024-05-18 DIAGNOSIS — I4510 Unspecified right bundle-branch block: Secondary | ICD-10-CM | POA: Insufficient documentation

## 2024-05-18 NOTE — Patient Instructions (Signed)
 Medication Instructions:  Your physician recommends that you continue on your current medications as directed. Please refer to the Current Medication list given to you today.  *If you need a refill on your cardiac medications before your next appointment, please call your pharmacy*  Testing/Procedures: Your physician has requested that you have an echocardiogram. Echocardiography is a painless test that uses sound waves to create images of your heart. It provides your doctor with information about the size and shape of your heart and how well your heart's chambers and valves are working. This procedure takes approximately one hour. There are no restrictions for this procedure. Please do NOT wear cologne, perfume, aftershave, or lotions (deodorant is allowed). Please arrive 15 minutes prior to your appointment time.  Follow-Up: At [facility], you and your health needs are our priority.  As part of our continuing mission to provide you with exceptional heart care, our providers are all part of one team.  This team includes your primary Cardiologist (physician) and Advanced Practice Providers or APPs (Physician Assistants and Nurse Practitioners) who all work together to provide you with the care you need, when you need it.  Your next appointment:   In 1 year with Dr. Mona

## 2024-05-18 NOTE — Telephone Encounter (Signed)
 Prescription refill request for Eliquis  received. Indication:afib Last office visit:6/24 Scr:0.85 Age: 89 Weight:78.4  kg  Prescription refilled

## 2024-05-18 NOTE — Progress Notes (Unsigned)
 OFFICE NOTE  Chief Complaint:  No complaints  Primary Care Physician: Shayne Anes, MD  HPI:  Sheena Benson is an 89 year old female now whom I have been following for atrial fibrillation which is persistent, is not chronic. She had a TEE cardioversion which restored sinus but quickly went back into AFib, was tried on Multaq and was intolerant of that, and actually was not really certain that she was aware of her AFib. Then we decided to pursue rate control and she has done very well with that. She has been therapeutic in her INRs on Coumadin  without any bleeding problems. She is without complaints. Her INR was checked today and it was 1.9.  Sheena Benson returns today for follow-up. She is having no complaints. Her INR has been well controlled and followed by Kristin, our anticoagulation pharmacist. Recently she had a upper respiratory infection and required a Z-Pak as well as prednisone and Tessalon Perles. She still has some cough but is improving.  Sheena Benson returns today for follow-up. Overall she's doing well. She denies any chest pain or worsening shortness of breath. INR has been stable. She remains in A. fib which is rate controlled. Blood pressures is at goal.  12/31/2016  Sheena Benson returns today for follow-up. Her last year she is without any complaints. She denies any chest pain or worsening shortness of breath. Her INR has been consistently at goal every 6 weeks. Today was 2.4 is due for a recheck his next month. Blood pressure is well controlled. Cholesterol is followed by her primary care provider. She remains in permanent atrial fibrillation.  01/06/2018  Sheena Benson was seen today in follow-up.  Again she is doing well.  She denies any chest pain or shortness of breath.  Her A. fib has been rate controlled.  Her INR is due for check today.  Generally has been stable.  She recently had some bleeding which she cut herself on a can lid however otherwise is done well.  01/23/2019  Sheena Benson is seen  today in annual follow-up.  She continues to do well.  She does note that she has to nap during the day but in general has pretty good energy level.  She remains in permanent A. fib at 88.  Blood pressure is well controlled.  Her PCP follows her dyslipidemia.  01/26/2020  Sheena Benson returns today for follow-up.  No new issues related to A. fib.  She is tolerating Eliquis  which she likes better than warfarin.  Cholesterol is well controlled.  Her blood pressure has been at target.  She is not yet received her COVID-19 vaccine however she is interested and will be looking into it.  02/03/2021  Sheena Benson returns today for follow-up.  She continues to do well.  Blood pressures well controlled today.  She remains in A. fib which is rate controlled.  She denies any bleeding issues on Eliquis .  Cholesterol was reasonably well controlled in August 2021 with total of 140, HDL 42, triglycerides 103 and LDL 77.  She is on simvastatin 10 mg daily.  02/21/2022  Sheena Benson returns today again for follow-up.  She continues to do well amazingly now at 89 years old.  Blood pressure is well controlled.  She has no issues with her A-fib which is permanent at this point and rate controlled.  She has no bleeding issues.  She denies chest pain or worsening shortness of breath.  Her lipids are well controlled back in September with an LDL 67.  She denies any bleeding  on Eliquis .  05/16/2023  Sheena Benson is seen today in follow-up. She is now 67, but is quite active and drives herself.  She denies any bleeding issues on Eliquis .  Her A-fib is rate controlled.  She has no chest pain or worsening shortness of breath.  Her LDL is well-controlled at 70.  PMHx:  Past Medical History:  Diagnosis Date   Atrial fibrillation (HCC)    intolerant to Multaq   CAD (coronary artery disease)    mild   History of nuclear stress test 08/2010   dipyridamole; mild ischemia in mid anterior & apical anterior regions; post-stress EF 72%   Hyperlipidemia     Hypertension    Uterine cancer (HCC) 1964    Past Surgical History:  Procedure Laterality Date   ABDOMINAL HYSTERECTOMY  1964   FRACTURE SURGERY     foot   TEE WITH CARDIOVERSION  08/07/2010   EF 55-60%; mild MR; SR back into AF (Dr. K. [place] Nayda Riesen)     FAMHx:  Family History  Problem Relation Age of Onset   Heart disease Mother     SOCHx:   reports that she has never smoked. She has never used smokeless tobacco. She reports that she does not drink alcohol and does not use drugs.  ALLERGIES:  No Known Allergies  ROS: Pertinent items noted in HPI and remainder of comprehensive ROS otherwise negative.  HOME MEDS: Current Outpatient Medications  Medication Sig Dispense Refill   CHLORTHALIDONE PO Take 1 tablet by mouth once a week.     Cholecalciferol (D3 ADULT PO) Take by mouth.     ELIQUIS  5 MG TABS tablet TAKE 1 TABLET TWICE A DAY 180 tablet 3   lisinopril (PRINIVIL,ZESTRIL) 20 MG tablet Take 20 mg by mouth at bedtime.   11   loratadine (CLARITIN) 10 MG tablet Take 10 mg by mouth daily as needed (for seasonal allergies).     meclizine (ANTIVERT) 25 MG tablet Take 25 mg by mouth 3 (three) times daily as needed (for vertigo).   1   metoprolol succinate (TOPROL-XL) 25 MG 24 hr tablet Take 25 mg by mouth at bedtime.     Multiple Vitamins-Minerals (MULTIVITAMIN WITH MINERALS) tablet Take 1 tablet by mouth daily.     simvastatin (ZOCOR) 10 MG tablet Take 10 mg by mouth at bedtime.      vitamin B-12 (CYANOCOBALAMIN) 250 MCG tablet Take 250 mcg by mouth daily.     No current facility-administered medications for this visit.    LABS/IMAGING: No results found for this or any previous visit (from the past 48 hours). No results found.  VITALS: BP (!) 136/94 (BP Location: Left Arm, Patient Position: Sitting, Cuff Size: Normal)   Pulse 84   Ht 5' 8 (1.727 m)   SpO2 94%   BMI 26.27 kg/m   EXAM: General appearance: alert and no distress Neck: no carotid bruit and no  JVD Lungs: clear to auscultation bilaterally Heart: irregularly irregular rhythm Abdomen: soft, non-tender; bowel sounds normal; no masses,  no organomegaly Extremities: extremities normal, atraumatic, no cyanosis or edema Pulses: 2+ and symmetric Skin: Skin color, texture, turgor normal. No rashes or lesions  EKG: EKG Interpretation Date/Time:  Tuesday May 18 2024 15:42:13 EDT Ventricular Rate:  84 PR Interval:    QRS Duration:  128 QT Interval:  394 QTC Calculation: 465 R Axis:   63  Text Interpretation: Atrial fibrillation Right bundle branch block When compared with ECG of 16-May-2023 15:49, Right bundle branch block  is now Present Confirmed by Mona Kent [phone]) on 05/18/2024 3:54:07 PM   ASSESSMENT: Permanent atrial fibrillation Hypertension - controlled Chronic anticoagulation Hyperlipidemia  PLAN: 1.   Sheena Benson is doing quite well and remains active now at 54.  She is in permanent A-fib with well-controlled hypertension.  No bleeding issues on Eliquis .  Her lipids are at goal.  No changes to her medicines today.  Plan follow-up annually or sooner as necessary.  Kent KYM Mona, MD, [facility], FACP  [place]  [facility]  Medical Director of the [facility] Diplomate of the American Board of Clinical Lipidology Attending Cardiologist  Direct Dial: [phone]  Fax: [phone]  Website:  [email or web address] Kent JAYSON Mona 05/18/2024, 3:54 PM

## 2024-06-01 DIAGNOSIS — H3530 Unspecified macular degeneration: Secondary | ICD-10-CM | POA: Diagnosis not present

## 2024-06-01 DIAGNOSIS — D6869 Other thrombophilia: Secondary | ICD-10-CM | POA: Diagnosis not present

## 2024-06-01 DIAGNOSIS — E559 Vitamin D deficiency, unspecified: Secondary | ICD-10-CM | POA: Diagnosis not present

## 2024-06-01 DIAGNOSIS — E785 Hyperlipidemia, unspecified: Secondary | ICD-10-CM | POA: Diagnosis not present

## 2024-06-01 DIAGNOSIS — J302 Other seasonal allergic rhinitis: Secondary | ICD-10-CM | POA: Diagnosis not present

## 2024-06-01 DIAGNOSIS — M8580 Other specified disorders of bone density and structure, unspecified site: Secondary | ICD-10-CM | POA: Diagnosis not present

## 2024-06-01 DIAGNOSIS — K449 Diaphragmatic hernia without obstruction or gangrene: Secondary | ICD-10-CM | POA: Diagnosis not present

## 2024-06-01 DIAGNOSIS — I480 Paroxysmal atrial fibrillation: Secondary | ICD-10-CM | POA: Diagnosis not present

## 2024-06-01 DIAGNOSIS — I10 Essential (primary) hypertension: Secondary | ICD-10-CM | POA: Diagnosis not present

## 2024-06-01 DIAGNOSIS — G609 Hereditary and idiopathic neuropathy, unspecified: Secondary | ICD-10-CM | POA: Diagnosis not present

## 2024-06-01 DIAGNOSIS — R7301 Impaired fasting glucose: Secondary | ICD-10-CM | POA: Diagnosis not present

## 2024-07-02 ENCOUNTER — Ambulatory Visit (HOSPITAL_COMMUNITY)
Admission: RE | Admit: 2024-07-02 | Discharge: 2024-07-02 | Disposition: A | Discharge status: Home / Self Care | Source: Ambulatory Visit | Attending: Internal Medicine | Admitting: Internal Medicine

## 2024-07-02 DIAGNOSIS — I4510 Unspecified right bundle-branch block: Secondary | ICD-10-CM | POA: Insufficient documentation

## 2024-07-02 DIAGNOSIS — I4821 Permanent atrial fibrillation: Secondary | ICD-10-CM | POA: Diagnosis not present

## 2024-07-02 LAB — ECHOCARDIOGRAM COMPLETE
Area-P 1/2: 4.5 cm2
S' Lateral: 2.5 cm

## 2024-07-08 ENCOUNTER — Ambulatory Visit: Payer: Self-pay | Admitting: Internal Medicine

## 2024-07-08 NOTE — Telephone Encounter (Signed)
 Patient returned RN's call regarding results.  Patient stated can also reach her on her mobile - [phone].

## 2024-09-07 DIAGNOSIS — Z23 Encounter for immunization: Secondary | ICD-10-CM | POA: Diagnosis not present
